# Patient Record
Sex: Female | Born: 1975 | Race: White | Hispanic: No | Marital: Married | Smoking: Current every day smoker
Health system: Southern US, Community
[De-identification: ages and names within clinical notes are randomized; demographics above are authoritative.]

## PROBLEM LIST (undated history)

## (undated) DIAGNOSIS — J45909 Unspecified asthma, uncomplicated: Secondary | ICD-10-CM

## (undated) DIAGNOSIS — J40 Bronchitis, not specified as acute or chronic: Secondary | ICD-10-CM

## (undated) HISTORY — PX: TUBAL LIGATION: SHX77

## (undated) HISTORY — PX: CHOLECYSTECTOMY: SHX55

---

## 2001-05-14 ENCOUNTER — Other Ambulatory Visit: Admission: RE | Admit: 2001-05-14 | Discharge: 2001-05-14 | Payer: Self-pay | Admitting: Obstetrics and Gynecology

## 2001-10-09 ENCOUNTER — Emergency Department (HOSPITAL_COMMUNITY): Admission: EM | Admit: 2001-10-09 | Discharge: 2001-10-09 | Payer: Self-pay | Admitting: Internal Medicine

## 2002-11-24 ENCOUNTER — Ambulatory Visit (HOSPITAL_COMMUNITY): Admission: RE | Admit: 2002-11-24 | Discharge: 2002-11-24 | Payer: Self-pay | Admitting: Obstetrics and Gynecology

## 2002-11-28 ENCOUNTER — Ambulatory Visit (HOSPITAL_COMMUNITY): Admission: RE | Admit: 2002-11-28 | Discharge: 2002-11-28 | Payer: Self-pay | Admitting: Obstetrics and Gynecology

## 2002-12-20 ENCOUNTER — Emergency Department (HOSPITAL_COMMUNITY): Admission: EM | Admit: 2002-12-20 | Discharge: 2002-12-21 | Payer: Self-pay | Admitting: Emergency Medicine

## 2002-12-20 ENCOUNTER — Encounter: Payer: Self-pay | Admitting: Emergency Medicine

## 2003-01-08 ENCOUNTER — Ambulatory Visit (HOSPITAL_COMMUNITY): Admission: AD | Admit: 2003-01-08 | Discharge: 2003-01-08 | Payer: Self-pay | Admitting: Obstetrics and Gynecology

## 2003-01-30 ENCOUNTER — Ambulatory Visit (HOSPITAL_COMMUNITY): Admission: AD | Admit: 2003-01-30 | Discharge: 2003-01-30 | Payer: Self-pay | Admitting: Obstetrics and Gynecology

## 2003-02-23 ENCOUNTER — Observation Stay (HOSPITAL_COMMUNITY): Admission: AD | Admit: 2003-02-23 | Discharge: 2003-02-24 | Payer: Self-pay | Admitting: Obstetrics and Gynecology

## 2003-03-14 ENCOUNTER — Ambulatory Visit (HOSPITAL_COMMUNITY): Admission: AD | Admit: 2003-03-14 | Discharge: 2003-03-14 | Payer: Self-pay | Admitting: Obstetrics and Gynecology

## 2003-04-10 ENCOUNTER — Inpatient Hospital Stay (HOSPITAL_COMMUNITY): Admission: RE | Admit: 2003-04-10 | Discharge: 2003-04-12 | Payer: Self-pay | Admitting: Obstetrics & Gynecology

## 2003-04-18 ENCOUNTER — Emergency Department (HOSPITAL_COMMUNITY): Admission: EM | Admit: 2003-04-18 | Discharge: 2003-04-18 | Payer: Self-pay | Admitting: Emergency Medicine

## 2003-05-09 ENCOUNTER — Emergency Department (HOSPITAL_COMMUNITY): Admission: EM | Admit: 2003-05-09 | Discharge: 2003-05-10 | Payer: Self-pay | Admitting: Emergency Medicine

## 2003-06-15 ENCOUNTER — Emergency Department (HOSPITAL_COMMUNITY): Admission: EM | Admit: 2003-06-15 | Discharge: 2003-06-16 | Payer: Self-pay | Admitting: Emergency Medicine

## 2003-06-21 ENCOUNTER — Emergency Department (HOSPITAL_COMMUNITY): Admission: EM | Admit: 2003-06-21 | Discharge: 2003-06-21 | Payer: Self-pay | Admitting: Emergency Medicine

## 2003-07-22 ENCOUNTER — Emergency Department (HOSPITAL_COMMUNITY): Admission: EM | Admit: 2003-07-22 | Discharge: 2003-07-22 | Payer: Self-pay | Admitting: Internal Medicine

## 2003-07-27 ENCOUNTER — Emergency Department (HOSPITAL_COMMUNITY): Admission: EM | Admit: 2003-07-27 | Discharge: 2003-07-28 | Payer: Self-pay | Admitting: *Deleted

## 2003-10-01 ENCOUNTER — Ambulatory Visit (HOSPITAL_COMMUNITY): Admission: RE | Admit: 2003-10-01 | Discharge: 2003-10-01 | Payer: Self-pay | Admitting: Obstetrics and Gynecology

## 2003-12-14 ENCOUNTER — Ambulatory Visit (HOSPITAL_COMMUNITY): Admission: AD | Admit: 2003-12-14 | Discharge: 2003-12-14 | Payer: Self-pay | Admitting: Obstetrics and Gynecology

## 2004-02-04 ENCOUNTER — Ambulatory Visit (HOSPITAL_COMMUNITY): Admission: AD | Admit: 2004-02-04 | Discharge: 2004-02-04 | Payer: Self-pay | Admitting: Obstetrics and Gynecology

## 2004-02-21 ENCOUNTER — Observation Stay (HOSPITAL_COMMUNITY): Admission: RE | Admit: 2004-02-21 | Discharge: 2004-02-21 | Payer: Self-pay | Admitting: Obstetrics and Gynecology

## 2004-03-04 ENCOUNTER — Ambulatory Visit (HOSPITAL_COMMUNITY): Admission: AD | Admit: 2004-03-04 | Discharge: 2004-03-04 | Payer: Self-pay | Admitting: Obstetrics and Gynecology

## 2004-03-07 ENCOUNTER — Ambulatory Visit (HOSPITAL_COMMUNITY): Admission: AD | Admit: 2004-03-07 | Discharge: 2004-03-07 | Payer: Self-pay | Admitting: Obstetrics and Gynecology

## 2004-03-15 ENCOUNTER — Inpatient Hospital Stay (HOSPITAL_COMMUNITY): Admission: RE | Admit: 2004-03-15 | Discharge: 2004-03-18 | Payer: Self-pay | Admitting: Obstetrics & Gynecology

## 2004-05-17 ENCOUNTER — Emergency Department (HOSPITAL_COMMUNITY): Admission: EM | Admit: 2004-05-17 | Discharge: 2004-05-17 | Payer: Self-pay | Admitting: Emergency Medicine

## 2004-10-10 ENCOUNTER — Emergency Department (HOSPITAL_COMMUNITY): Admission: EM | Admit: 2004-10-10 | Discharge: 2004-10-10 | Payer: Self-pay | Admitting: Emergency Medicine

## 2004-10-24 ENCOUNTER — Emergency Department (HOSPITAL_COMMUNITY): Admission: EM | Admit: 2004-10-24 | Discharge: 2004-10-24 | Payer: Self-pay | Admitting: Emergency Medicine

## 2005-03-13 ENCOUNTER — Emergency Department (HOSPITAL_COMMUNITY): Admission: EM | Admit: 2005-03-13 | Discharge: 2005-03-13 | Payer: Self-pay | Admitting: Emergency Medicine

## 2005-08-22 ENCOUNTER — Emergency Department (HOSPITAL_COMMUNITY): Admission: EM | Admit: 2005-08-22 | Discharge: 2005-08-22 | Payer: Self-pay | Admitting: Emergency Medicine

## 2005-09-05 ENCOUNTER — Emergency Department (HOSPITAL_COMMUNITY): Admission: EM | Admit: 2005-09-05 | Discharge: 2005-09-05 | Payer: Self-pay | Admitting: Emergency Medicine

## 2005-09-18 ENCOUNTER — Ambulatory Visit (HOSPITAL_COMMUNITY): Admission: RE | Admit: 2005-09-18 | Discharge: 2005-09-18 | Payer: Self-pay | Admitting: Urology

## 2005-10-10 ENCOUNTER — Emergency Department (HOSPITAL_COMMUNITY): Admission: EM | Admit: 2005-10-10 | Discharge: 2005-10-10 | Payer: Self-pay | Admitting: Emergency Medicine

## 2005-10-17 ENCOUNTER — Emergency Department (HOSPITAL_COMMUNITY): Admission: EM | Admit: 2005-10-17 | Discharge: 2005-10-18 | Payer: Self-pay | Admitting: Emergency Medicine

## 2005-10-26 ENCOUNTER — Emergency Department (HOSPITAL_COMMUNITY): Admission: EM | Admit: 2005-10-26 | Discharge: 2005-10-26 | Payer: Self-pay | Admitting: Emergency Medicine

## 2005-12-29 ENCOUNTER — Emergency Department (HOSPITAL_COMMUNITY): Admission: EM | Admit: 2005-12-29 | Discharge: 2005-12-29 | Payer: Self-pay | Admitting: Emergency Medicine

## 2006-04-01 ENCOUNTER — Emergency Department (HOSPITAL_COMMUNITY): Admission: EM | Admit: 2006-04-01 | Discharge: 2006-04-01 | Payer: Self-pay | Admitting: Emergency Medicine

## 2006-06-29 ENCOUNTER — Emergency Department (HOSPITAL_COMMUNITY): Admission: EM | Admit: 2006-06-29 | Discharge: 2006-06-29 | Payer: Self-pay | Admitting: Emergency Medicine

## 2006-09-15 ENCOUNTER — Emergency Department (HOSPITAL_COMMUNITY): Admission: EM | Admit: 2006-09-15 | Discharge: 2006-09-15 | Payer: Self-pay | Admitting: Emergency Medicine

## 2006-09-25 ENCOUNTER — Emergency Department (HOSPITAL_COMMUNITY): Admission: EM | Admit: 2006-09-25 | Discharge: 2006-09-26 | Payer: Self-pay | Admitting: Emergency Medicine

## 2006-09-30 ENCOUNTER — Emergency Department (HOSPITAL_COMMUNITY): Admission: EM | Admit: 2006-09-30 | Discharge: 2006-10-01 | Payer: Self-pay | Admitting: Emergency Medicine

## 2006-12-03 ENCOUNTER — Emergency Department (HOSPITAL_COMMUNITY): Admission: EM | Admit: 2006-12-03 | Discharge: 2006-12-03 | Payer: Self-pay | Admitting: Emergency Medicine

## 2007-03-06 ENCOUNTER — Emergency Department (HOSPITAL_COMMUNITY): Admission: EM | Admit: 2007-03-06 | Discharge: 2007-03-06 | Payer: Self-pay | Admitting: Emergency Medicine

## 2007-04-02 ENCOUNTER — Emergency Department (HOSPITAL_COMMUNITY): Admission: EM | Admit: 2007-04-02 | Discharge: 2007-04-02 | Payer: Self-pay | Admitting: Emergency Medicine

## 2007-04-06 ENCOUNTER — Emergency Department (HOSPITAL_COMMUNITY): Admission: EM | Admit: 2007-04-06 | Discharge: 2007-04-07 | Payer: Self-pay | Admitting: *Deleted

## 2007-04-16 ENCOUNTER — Emergency Department (HOSPITAL_COMMUNITY): Admission: EM | Admit: 2007-04-16 | Discharge: 2007-04-16 | Payer: Self-pay | Admitting: Emergency Medicine

## 2007-05-03 ENCOUNTER — Emergency Department (HOSPITAL_COMMUNITY): Admission: EM | Admit: 2007-05-03 | Discharge: 2007-05-03 | Payer: Self-pay | Admitting: Emergency Medicine

## 2007-07-14 ENCOUNTER — Emergency Department (HOSPITAL_COMMUNITY): Admission: EM | Admit: 2007-07-14 | Discharge: 2007-07-14 | Payer: Self-pay | Admitting: Emergency Medicine

## 2007-07-29 ENCOUNTER — Emergency Department (HOSPITAL_COMMUNITY): Admission: EM | Admit: 2007-07-29 | Discharge: 2007-07-29 | Payer: Self-pay | Admitting: Emergency Medicine

## 2007-08-11 ENCOUNTER — Emergency Department (HOSPITAL_COMMUNITY): Admission: EM | Admit: 2007-08-11 | Discharge: 2007-08-11 | Payer: Self-pay | Admitting: Emergency Medicine

## 2007-08-26 ENCOUNTER — Emergency Department (HOSPITAL_COMMUNITY): Admission: EM | Admit: 2007-08-26 | Discharge: 2007-08-26 | Payer: Self-pay | Admitting: Emergency Medicine

## 2007-10-07 ENCOUNTER — Emergency Department (HOSPITAL_COMMUNITY): Admission: EM | Admit: 2007-10-07 | Discharge: 2007-10-07 | Payer: Self-pay | Admitting: Emergency Medicine

## 2007-12-03 ENCOUNTER — Emergency Department (HOSPITAL_COMMUNITY): Admission: EM | Admit: 2007-12-03 | Discharge: 2007-12-03 | Payer: Self-pay | Admitting: Emergency Medicine

## 2009-06-12 ENCOUNTER — Emergency Department (HOSPITAL_COMMUNITY): Admission: EM | Admit: 2009-06-12 | Discharge: 2009-06-12 | Payer: Self-pay | Admitting: Emergency Medicine

## 2010-02-12 ENCOUNTER — Emergency Department: Payer: Self-pay | Admitting: Emergency Medicine

## 2010-12-09 NOTE — H&P (Signed)
[name], [name]                ACCOUNT NO.:  [id number]   MEDICAL RECORD NO.:  [id number]          PATIENT TYPE:  AMB   LOCATION:                                FACILITY:  [facility]   PHYSICIAN:  Dennie Maizes, M.D.   DATE OF BIRTH:  08-17-1975   DATE OF ADMISSION:  09/18/2005  DATE OF DISCHARGE:  [facility]                                HISTORY & PHYSICAL   CHIEF COMPLAINT:  Left flank pain, left distal ureteral calculus and  obstruction.   HISTORY OF PRESENT ILLNESS:  This 35 year old female is referred to me by  the ER physician at [facility].  She experienced sudden onset of  severe left flank pain radiating to the front on September 05, 2005.  The  pain was very severe 9/10.  She also noticed mild hematuria and low-grade  fever.  She had urinary frequency x7-9 and nocturia x0.  She was passing  really small amounts of urine with each voiding.  She does not have a past  history of urolithiasis.  There is no history of urinary tract infection or  hematuria in the past.   Urinalysis in the emergency room revealed microhematuria.  The patient has  been evaluated with a noncontrast CT scan of abdomen and pelvis.  This  revealed a 6x2 mm size left distal ureteral calculus at the level of the  left ureterosacral junction at the proximal hydroureter and hydronephrosis.  The patient has not passed a stone.  She still has persistent moderate to  severe pain.  She is brought to the short-stay center for cystoscopy, left  retrograde pyelogram, ureteroscopy stone extraction and ureteral stent  placement.   PAST MEDICAL HISTORY:  Status post C-section x2 in 2004 and 2005.   MEDICATIONS:  Pain pill given by the emergency room, oxycodone, Cipro.   ALLERGIES:  DARVOCET and NONSTEROIDAL ANTI-INFLAMMATORY DRUGS.   EXAMINATION:  VITAL SIGNS:  Height 5 feet 7 inches, weight 290 pounds.  HEAD/EYES/EARS/NOSE AND THROAT:  Normal.  NECK:  No masses.  LUNGS:  Clear to auscultation.  HEART:   Regular rate and rhythm, no murmurs.  ABDOMEN:  Soft, no palpable flank mass, moderate costovertebral angle  tenderness was noted, bladder not palpable, no suprapubic tenderness.   IMPRESSION:  Left distal ureteral calculus with obstruction, left renal  colic, left hydronephrosis.   PLAN:  I discussed with the patient regarding the management options.  As  she has severe persistent pain, she wanted to undergo surgical removal of  the stone.  She is clear to undergo cystoscopy, left retrograde pyelogram  with  left ureteroscopy stone extraction and stent placement on September 18, 2005  at [facility].  I have discussed with the patient regarding the  diagnosis, operative details, the alternate treatments, the outcome,  possible risks and complications and she has agreed for the procedure to be  done.      Dennie Maizes, M.D.  Electronically Signed     SK/MEDQ  D:  09/17/2005  T:  09/17/2005  Job:  [id number]

## 2010-12-09 NOTE — Discharge Summary (Signed)
NAME:  [name], [name]                          ACCOUNT NO.:  [id number]   MEDICAL RECORD NO.:  [id number]                   PATIENT TYPE:  INP   LOCATION:  A418                                 FACILITY:  [facility]   PHYSICIAN:  Lazaro Arms, M.D.                DATE OF BIRTH:  [birth year]   DATE OF ADMISSION:  03/15/2004  DATE OF DISCHARGE:  03/18/2004                                 DISCHARGE SUMMARY   DISCHARGE DIAGNOSES:  1. Status post a repeat cesarean section with bilateral tubal ligation.  2. Uncomplicated postoperative course.   PROCEDURE:  Repeat cesarean section with bilateral tubal ligation.   Please refer to the transcribed history and physical, as well as the  antepartum chart, and the op note for details admission to the hospital.   HOSPITAL COURSE:  The patient was admitted after surgery.  She had an  unremarkable intraoperative course.  She was delivered of a viable female  infant weighing 7 pounds 7 ounces, had Apgar's of 8 and 9.  The infant did  suffer with post partum transient tachypnea of the newborn that was resolved  within 24 hours.  The baby is now doing fine.  No problems.  Feeding well.  Post-op day #1:  Her hemoglobin was 8.9, and her hematocrit was 25.  On post-  op day #3, it was stable.  Her preoperative H&H was 12 and 37.  She did have  some postpartum hypotonia and was given Methergine postoperatively for 24  hours and that significantly cut down her bleeding.  Her hemoglobin  obviously stable from post-op day #1 to post-op day #3.  She is ambulatory  without any symptoms.  She is tolerating her oral pain medicine.  Her  incision is clean, dry and intact.  Her abdominal exam is benign.  She has  had flatus and bowel movements.  She is discharged to home on the morning of  post-op day #3 in good stable condition to follow up in the office next week  to have her staples removed.     ___________________________________________              Lazaro Arms, M.D.   Loraine Maple  D:  03/18/2004  T:  03/18/2004  Job:  [id number]

## 2010-12-09 NOTE — Op Note (Signed)
[name], [name]                ACCOUNT NO.:  [id number]   MEDICAL RECORD NO.:  [id number]          PATIENT TYPE:  AMB   LOCATION:  DAY                           FACILITY:  [facility]   PHYSICIAN:  Dennie Maizes, M.D.   DATE OF BIRTH:  [birth year]   DATE OF PROCEDURE:  09/18/2005  DATE OF DISCHARGE:                                 OPERATIVE REPORT   PREOP DIAGNOSES:  1.  Left distal ureteral calculus with obstruction.  2.  Left renal colic.   POSTOP DIAGNOSES:  1.  Left distal ureteral calculus with obstruction.  2.  Left renal colic.   OPERATIVE PROCEDURE:  1.  Cystoscopy.  2.  Left retrograde pyelogram.  3.  Left ureteroscopy.  4.  Left ureteral stent placement.   ANESTHESIA:  General.   SURGEON:  Dennie Maizes, M.D.   COMPLICATIONS:  None.   INDICATIONS FOR PROCEDURE:  This 35 year old female was evaluated for severe  left flank pain. X-rays revealed a 6.2 mm size left distal ureteral calculus  with obstruction and hydronephrosis. The patient was unable to pass the  stone; and she was taken to the OR, today, for cystoscopy, left retrograde  pyelogram, left ureteroscopy, stone extraction, and a stent placement.   DESCRIPTION OF PROCEDURE:  Preoperative KUB revealed a calcific shadow in  the left mid pelvis. It was not sure whether this represented a calculus or  a phlebolith  The patient had persistent pain; and she was taken to the OR  for further evaluation.   General anesthesia was induced and the patient was placed on the OR table in  the dorsolithotomy position. The lower abdomen and genitalia were prepped  and draped in a sterile fashion. Cystoscopy was done with the 25-French  scope. The bladder mucosa was unremarkable. The trigone and right ureteral  orifice were normal. There was prominence of the left ureteral orifice with  edema and erythema. The 5-French wedge catheter was then placed in the left  ureteral orifice; and the retrograde pyelogram was done by  using 7 mL of  renografin-60 with fluoroscopy. There was some irregularity of the distal  ureter, but I could not see any definite filling defect. There was no  hydroureter or hydronephrosis.   The 5-French open-ended catheter was then placed in the left ureteral  orifice. A 0.138-inch Bentson guidewire with the flexible tip was then  advanced into the left renal pelvis. An 8.5 French rigid ureteroscope was  then inserted into the distal ureter. Examination was done up to the level  of pelvic brim; and no stone was seen. In the distal ureter, there was an  area of erythema suggestive of the site of impaction of the stone. I feel  the patient may have passed the stone. A 6 French, 26 cm size stent was then  inserted over the guidewire and placed in the left collecting system. The  patient was transferred to the PACU in a satisfactory condition.      Dennie Maizes, M.D.  Electronically Signed     SK/MEDQ  D:  09/18/2005  T:  09/18/2005  Job:  [id number]

## 2010-12-09 NOTE — Op Note (Signed)
NAME:  [name], [name]                          ACCOUNT NO.:  [id number]   MEDICAL RECORD NO.:  [id number]                   PATIENT TYPE:  INP   LOCATION:  A418                                 FACILITY:  [facility]   PHYSICIAN:  Lazaro Arms, M.D.                DATE OF BIRTH:  [birth year]   DATE OF PROCEDURE:  03/15/2004  DATE OF DISCHARGE:                                 OPERATIVE REPORT   PREOPERATIVE DIAGNOSES:  1. Intrauterine pregnancy at 38-6/[redacted] weeks gestation.  2. Previous Cesarean section.  3. Desired sterilization.   POSTOPERATIVE DIAGNOSES:  1. Intrauterine pregnancy at 38-6/[redacted] weeks gestation.  2. Previous Cesarean section.  3. Desired sterilization.   PROCEDURE:  Repeat Cesarean section with bilateral tubal ligation.   SURGEON:  Lazaro Arms, M.D.   ANESTHESIA:  Spinal.   FINDINGS:  Over a low transverse hysterotomy incision was delivered a viable  female infant at 68 with Apgars of 8 and 9, weighing 7 pounds and 7 ounces.  The vacuum extractor was used to easily facilitate delivery.  There was a  loose nuchal cord x1.  The placenta and cord were normal.  There were three  vessels in the cord.  Cord blood and cord gas were sent.  The uterus, tubes,  and ovaries were otherwise normal.   DESCRIPTION OF PROCEDURE:  The patient was taken to the operating room and  placed in the sitting position where she underwent a spinal anesthetic.  She  was then placed in the supine position with a roll under her right hip.  She  was prepped and draped in the usual sterile fashion, and a Foley catheter  was placed for continuous urine drainage.   A Pfannenstiel skin incision was made and carried down sharply to the rectus  fascia which was scored in the midline and extended laterally.  The fascia  was taken off of the muscles superiorly and inferiorly without difficulty.  The muscles were divided and the peritoneal cavity was entered.  A bladder  blade was placed.  The  vesicouterine serosal flap was created.  A low  transverse hysterotomy incision was made, and over this incision was  delivered a viable female at 0830 with Apgars of 8 and 9, weighing 7 pounds  and 7 ounces.  There was a loose nuchal cord x1 and a three-vessel cord.  Cord blood and cord gas were sent.  The infant was handed to Dr. Milford Cage who  was in attendance for routine neonatal resuscitation.  The placenta was  delivered spontaneously and not sent to pathology.  The uterus was  exteriorized and wiped clean with a clean lap pad.  It was closed in two  layers, the first being a running interlocking layer and the second being an  imbricating layer.  There was good hemostasis.   A modified Pomeroy bilateral tubal ligation was performed using 0 plain gut  bilaterally.  There was good hemostasis.  An approximately 2-cm segment was  taken out bilaterally.  The uterus was replaced.  All of the pedicles were  once again inspected and found to be hemostatic.  The muscles and peritoneum  were reapproximated loosely after the peritoneal cavity was irrigated.  The  fascia was closed using 0 Vicryl running.  The subcutaneous tissue was made  hemostatic and irrigated.  The skin was closed using skin staples.   The patient tolerated the procedure well.  She experienced 500 cc of blood  loss and was taken to the recovery room in good and stable condition.  All  counts were correct x3.      ___________________________________________                                            Lazaro Arms, M.D.   LHE/MEDQ  D:  03/15/2004  T:  03/15/2004  Job:  [id number]
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
## 2010-12-09 NOTE — H&P (Signed)
   NAME:  [name], [name]                          ACCOUNT NO.:  [id number]   MEDICAL RECORD NO.:  [id number]                   PATIENT TYPE:  AMB   LOCATION:  DAY                                  FACILITY:  [facility]   PHYSICIAN:  Lazaro Arms, M.D.                DATE OF BIRTH:  06-09-1976   DATE OF ADMISSION:  04/10/2003  DATE OF DISCHARGE:                                HISTORY & PHYSICAL   HISTORY OF PRESENT ILLNESS:  The patient is Cooley 35 year old white female,  gravida 3, para 1, abortus 1, with estimated date of delivery of April 21, 2003, currently at 38-2/7 weeks' gestation, who is admitted for Cooley  primary cesarean section because of Cooley breech-presentation baby.  She  declined an external cephalic version attempt and she is admitted for Cooley C-  section.   PAST MEDICAL HISTORY:  1. Significant for asthma.  2. Migraines.   PAST SURGICAL HISTORY:  D&C.   PAST OBSTETRICAL HISTORY:  She had Cooley vaginal delivery in 1999 and Cooley  miscarriage in 2002.   ALLERGIES:  None.   MEDICATIONS:  Prenatal vitamins and iron.   SOCIAL HISTORY:  She is single and lives with the father of the baby.  She  is currently unemployed.   PRENATAL LABORATORY DATA:  Blood type is Cooley-positive.  Antibody screen is  negative.  Rubella is immune.  HIV was nonreactive.  Hepatitis B was  negative.  Serology was nonreactive.  Her Pap smear was normal.  She had Cooley  positive Chlamydia test at the beginning of the pregnancy and was treated  and the repeat was negative for GC and Chlamydia.  Her group B strep was  positive.  Her Glucola was 75.   PHYSICAL EXAMINATION:  HEENT:  Unremarkable.  NECK:  Thyroid was normal.  LUNGS:  Lungs are clear.  HEART:  Heart is regular rate and rhythm without murmur, regurgitation or  gallop.  BREASTS:  Deferred.  ABDOMEN:  Fundal height is 39 cm.  PELVIC:  Her cervix is long, thick and closed.  EXTREMITIES:  Extremities warm with trace edema.   IMPRESSION:  1. Intrauterine  pregnancy at 38-plus-weeks' gestation.  2. Breech presentation.  3. Declines external cephalic version attempt.   PLAN:  The patient is admitted for primary cesarean section.  She  understands the risks, benefits, indications and alternatives and will  proceed.                                               Lazaro Arms, M.D.    Loraine Maple  D:  04/09/2003  T:  04/09/2003  Job:  [id number]

## 2010-12-09 NOTE — Discharge Summary (Signed)
   NAME:  [name], [name]                          ACCOUNT NO.:  [id number]   MEDICAL RECORD NO.:  [id number]                   PATIENT TYPE:  INP   LOCATION:  A418                                 FACILITY:  [facility]   PHYSICIAN:  Lazaro Arms, M.D.                DATE OF BIRTH:  September 23, 1975   DATE OF ADMISSION:  04/10/2003  DATE OF DISCHARGE:  04/12/2003                                 DISCHARGE SUMMARY   DISCHARGE DIAGNOSES:  1. Status post a primary cesarean section.  2. Unremarkable postoperative course.   PROCEDURE:  Primary cesarean section.   Please refer to the transcribed History and Physical and the operative note  for details of admission to the hospital.   HOSPITAL COURSE:  The patient was admitted postoperatively.  She had an  unremarkable postoperative course tolerating clear liquids and a regular  diet, voided without symptoms, was ambulatory.  Her hemoglobin and  hematocrit were appropriate and stable; postoperative day #1 were 10 and  29.7 with a normal white count.  Her incision was clean, dry, and intact.  She was discharged to home on the morning of postoperative day #2 in good  stable condition to follow up in the office next week to have her incision  evaluated and staples removed.  She was given instructions and precautions  for return prior to that time and Tylox and Motrin for pain.     ___________________________________________                                         Lazaro Arms, M.D.   LHE/MEDQ  D:  05/21/2003  T:  05/21/2003  Job:  [id number]

## 2010-12-09 NOTE — Op Note (Signed)
NAME:  [name], [name]                          ACCOUNT NO.:  [id number]   MEDICAL RECORD NO.:  [id number]                   PATIENT TYPE:  INP   LOCATION:  A418                                 FACILITY:  [facility]   PHYSICIAN:  Lazaro Arms, M.D.                DATE OF BIRTH:  1976/02/10   DATE OF PROCEDURE:  04/10/2003  DATE OF DISCHARGE:                                 OPERATIVE REPORT   PREOPERATIVE DIAGNOSES:  1. Intrauterine pregnancy at 38-2/[redacted] weeks gestation.  2. Breech presentation.  The patient declined external cephalic version     attempt.   POSTOPERATIVE DIAGNOSES:  1. Intrauterine pregnancy at 38-2/[redacted] weeks gestation.  2. Cephalic presentation.  3. Nuchal cord x4.   PROCEDURE:  Primary cesarean section.   SURGEON:  Lazaro Arms, M.D.   ANESTHESIA:  Spinal.   FINDINGS:  Over a low transverse hysterotomy incision was delivered a viable  female infant with Apgars of 9 and 9.  Interestingly at the time of surgery,  the infant was in the vertex presentation but had nuchal cord x4.  There was  clear amniotic fluid.  The placenta was normal.  The uterus, tubes, and  ovaries were normal.   DESCRIPTION OF PROCEDURE:  The patient was taken to the operating room and  placed in the sitting position where she underwent a spinal anesthetic.  She  was then placed in the supine position with a roll under her right hip.  She  was prepped and draped in the usual sterile fashion.   A Pfannenstiel skin incision was made and carried down sharply to the rectus  fascia which was scored in the midline and extended laterally.  The fascia  was taken off of the muscles superiorly and inferiorly without difficulty.  The muscles were divided and the peritoneal cavity entered.  The  vesicouterine serosal flap was created.  A low transverse hysterotomy  incision was made, and over this incision was delivered at 0821 on 04/10/2003  at viable female with Apgars of 9 and 9, weighing 7 pounds 9  ounces.  There  was a nuchal cord x4.  The infant was vertex.  The uterus was exteriorized.  After the placenta was delivered, it was closed in two layers, the first  being a running interlocking layer, the second being an imbricating layer.  The uterus was replaced into the peritoneal cavity.  All of the pedicles  were hemostatic.  The muscles were reapproximated loosely.  The fascia was  closed using 0 Vicryl running.  The subcutaneous tissue was made hemostatic  and irrigated.  The skin was closed using skin staples.   The patient tolerated the procedure well.  She experienced 750 cc of  blood  loss and was taken to the recovery room in good and stable condition.  All  counts were correct.      ___________________________________________  Lazaro Arms, M.D.   Loraine Maple  D:  05/21/2003  T:  05/21/2003  Job:  [id number]

## 2010-12-25 ENCOUNTER — Emergency Department (HOSPITAL_COMMUNITY)
Admission: EM | Admit: 2010-12-25 | Discharge: 2010-12-25 | Disposition: A | Discharge status: Home / Self Care | Payer: Medicaid Other | Attending: Emergency Medicine | Admitting: Emergency Medicine

## 2010-12-25 ENCOUNTER — Emergency Department (HOSPITAL_COMMUNITY): Payer: Medicaid Other

## 2010-12-25 DIAGNOSIS — S6390XA Sprain of unspecified part of unspecified wrist and hand, initial encounter: Secondary | ICD-10-CM | POA: Insufficient documentation

## 2010-12-25 DIAGNOSIS — R000 Tachycardia, unspecified: Secondary | ICD-10-CM | POA: Insufficient documentation

## 2010-12-25 DIAGNOSIS — F17200 Nicotine dependence, unspecified, uncomplicated: Secondary | ICD-10-CM | POA: Insufficient documentation

## 2010-12-25 DIAGNOSIS — S93409A Sprain of unspecified ligament of unspecified ankle, initial encounter: Secondary | ICD-10-CM | POA: Insufficient documentation

## 2010-12-25 DIAGNOSIS — Y998 Other external cause status: Secondary | ICD-10-CM | POA: Insufficient documentation

## 2011-09-18 ENCOUNTER — Emergency Department: Payer: Self-pay | Admitting: *Deleted

## 2011-09-18 LAB — URINALYSIS, COMPLETE
Bacteria: NONE SEEN
Bilirubin,UR: NEGATIVE
Glucose,UR: NEGATIVE mg/dL (ref 0–75)
Ketone: NEGATIVE
Leukocyte Esterase: NEGATIVE
Ph: 6 (ref 4.5–8.0)
RBC,UR: 3 /HPF (ref 0–5)
Specific Gravity: 1.014 (ref 1.003–1.030)
Squamous Epithelial: 2

## 2011-10-20 ENCOUNTER — Emergency Department: Payer: Self-pay | Admitting: Emergency Medicine

## 2012-12-31 ENCOUNTER — Encounter (HOSPITAL_COMMUNITY): Payer: Self-pay | Admitting: *Deleted

## 2012-12-31 ENCOUNTER — Emergency Department (HOSPITAL_COMMUNITY)
Admission: EM | Admit: 2012-12-31 | Discharge: 2012-12-31 | Disposition: A | Discharge status: Home / Self Care | Payer: Medicaid Other | Attending: Emergency Medicine | Admitting: Emergency Medicine

## 2012-12-31 DIAGNOSIS — J45901 Unspecified asthma with (acute) exacerbation: Secondary | ICD-10-CM | POA: Insufficient documentation

## 2012-12-31 DIAGNOSIS — Y929 Unspecified place or not applicable: Secondary | ICD-10-CM | POA: Insufficient documentation

## 2012-12-31 DIAGNOSIS — IMO0002 Reserved for concepts with insufficient information to code with codable children: Secondary | ICD-10-CM | POA: Insufficient documentation

## 2012-12-31 DIAGNOSIS — S80811D Abrasion, right lower leg, subsequent encounter: Secondary | ICD-10-CM

## 2012-12-31 DIAGNOSIS — W1789XA Other fall from one level to another, initial encounter: Secondary | ICD-10-CM | POA: Insufficient documentation

## 2012-12-31 DIAGNOSIS — L509 Urticaria, unspecified: Secondary | ICD-10-CM | POA: Insufficient documentation

## 2012-12-31 DIAGNOSIS — L299 Pruritus, unspecified: Secondary | ICD-10-CM | POA: Insufficient documentation

## 2012-12-31 DIAGNOSIS — F17200 Nicotine dependence, unspecified, uncomplicated: Secondary | ICD-10-CM | POA: Insufficient documentation

## 2012-12-31 DIAGNOSIS — Y939 Activity, unspecified: Secondary | ICD-10-CM | POA: Insufficient documentation

## 2012-12-31 DIAGNOSIS — Z79899 Other long term (current) drug therapy: Secondary | ICD-10-CM | POA: Insufficient documentation

## 2012-12-31 HISTORY — DX: Unspecified asthma, uncomplicated: J45.909

## 2012-12-31 HISTORY — DX: Bronchitis, not specified as acute or chronic: J40

## 2012-12-31 MED ORDER — HYDROCODONE-ACETAMINOPHEN 5-325 MG PO TABS: 1.0000 | ORAL_TABLET | ORAL | Status: DC | PRN

## 2012-12-31 MED ORDER — BACITRACIN-NEOMYCIN-POLYMYXIN 400-5-5000 EX OINT: TOPICAL_OINTMENT | Freq: Once | CUTANEOUS | Status: DC

## 2012-12-31 MED ORDER — BACITRACIN ZINC 500 UNIT/GM EX OINT
TOPICAL_OINTMENT | CUTANEOUS | Status: AC
  Administered 2012-12-31: 4
  Filled 2012-12-31: qty 2.7
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
## 2013-01-07 NOTE — ED Provider Notes (Signed)
Medical screening examination/treatment/procedure(s) were performed by non-physician practitioner and as supervising physician I was immediately available for consultation/collaboration.  Ozetta Flatley, MD 01/07/13 0807 

## 2013-04-30 ENCOUNTER — Emergency Department: Payer: Self-pay | Admitting: Emergency Medicine

## 2013-05-28 ENCOUNTER — Encounter (HOSPITAL_COMMUNITY): Payer: Self-pay | Admitting: Emergency Medicine

## 2013-05-28 ENCOUNTER — Emergency Department (HOSPITAL_COMMUNITY): Payer: Medicaid Other

## 2013-05-28 ENCOUNTER — Emergency Department (HOSPITAL_COMMUNITY)
Admission: EM | Admit: 2013-05-28 | Discharge: 2013-05-28 | Disposition: A | Discharge status: Home / Self Care | Payer: Medicaid Other | Attending: Emergency Medicine | Admitting: Emergency Medicine

## 2013-05-28 DIAGNOSIS — J40 Bronchitis, not specified as acute or chronic: Secondary | ICD-10-CM

## 2013-05-28 DIAGNOSIS — F17200 Nicotine dependence, unspecified, uncomplicated: Secondary | ICD-10-CM | POA: Insufficient documentation

## 2013-05-28 DIAGNOSIS — L739 Follicular disorder, unspecified: Secondary | ICD-10-CM

## 2013-05-28 DIAGNOSIS — L738 Other specified follicular disorders: Secondary | ICD-10-CM | POA: Insufficient documentation

## 2013-05-28 DIAGNOSIS — J45901 Unspecified asthma with (acute) exacerbation: Secondary | ICD-10-CM | POA: Insufficient documentation

## 2013-05-28 DIAGNOSIS — J029 Acute pharyngitis, unspecified: Secondary | ICD-10-CM | POA: Insufficient documentation

## 2013-05-28 DIAGNOSIS — Z79899 Other long term (current) drug therapy: Secondary | ICD-10-CM | POA: Insufficient documentation

## 2013-05-28 MED ORDER — DOXYCYCLINE HYCLATE 100 MG PO CAPS: 100.0000 mg | ORAL_CAPSULE | Freq: Two times a day (BID) | ORAL | Status: DC

## 2013-05-28 MED ORDER — PREDNISONE 10 MG PO TABS: ORAL_TABLET | ORAL | Status: DC

## 2013-05-28 MED ORDER — HYDROCODONE-ACETAMINOPHEN 5-325 MG PO TABS: 1.0000 | ORAL_TABLET | ORAL | Status: DC | PRN

## 2013-05-28 MED ORDER — DOXYCYCLINE HYCLATE 100 MG PO TABS
100.0000 mg | ORAL_TABLET | Freq: Once | ORAL | Status: AC
  Administered 2013-05-28: 100 mg via ORAL
  Filled 2013-05-28: qty 1

## 2013-05-28 MED ORDER — PREDNISONE 50 MG PO TABS
60.0000 mg | ORAL_TABLET | Freq: Once | ORAL | Status: AC
  Administered 2013-05-28: 21:00:00 60 mg via ORAL
  Filled 2013-05-28 (×2): qty 1

## 2013-05-28 MED ORDER — MELOXICAM 7.5 MG PO TABS: 7.5000 mg | ORAL_TABLET | Freq: Every day | ORAL | Status: DC

## 2013-05-28 NOTE — ED Notes (Signed)
Attempted to explain importance of taking abx to pt. Pt refused to take the medications. Wants pain med. Pt states she does not need abx, she needs pain medication.

## 2013-05-28 NOTE — ED Notes (Signed)
Pt refused medications. Would not take them.

## 2013-05-28 NOTE — ED Notes (Signed)
Sore throat, hurts to swallow, and I have been coughing. Also have a boil or abscess on my right inner thigh per pt.

## 2013-05-28 NOTE — ED Notes (Signed)
Pt finally agreed to take her medications.

## 2013-05-30 NOTE — ED Provider Notes (Signed)
CSN: [id number]     Arrival date & time 05/28/13  1925 History   First MD Initiated Contact with Patient 05/28/13 1948     Chief Complaint  Patient presents with  . Sore Throat  . Cough  . Abscess   (Consider location/radiation/quality/duration/timing/severity/associated sxs/prior Treatment) HPI Comments: [name] is a 37 y.o. Female presenting with 2 complaints,  The first being a week long history of nonproductive cough with episodes of wheezing and shortness of breath.  She has now developed a sore throat which is dry, painful and worse with coughing.  She denies fever, chest pain and has not had nasal congestion, sinus pain, ear ache or drainage. She reports history of asthma for which she uses albuterol prn,  Last dose taken early this morning.  Secondly,  She reports painful abscess on her right medial upper thigh which has been present for several days.  There has been no drainage from the site. Pain is worsened with palpation.  She denies any treatments or alleviators.     The history is provided by the patient.    Past Medical History  Diagnosis Date  . Asthma   . Bronchitis    Past Surgical History  Procedure Laterality Date  . Cholecystectomy    . Tubal ligation     History reviewed. No pertinent family history. History  Substance Use Topics  . Smoking status: Current Every Day Smoker -- 0.50 packs/day    Types: Cigarettes  . Smokeless tobacco: Never Used  . Alcohol Use: No   OB History   Grav Para Term Preterm Abortions TAB SAB Ect Mult Living   2 2 2             Review of Systems  Constitutional: Negative for fever.  HENT: Positive for sore throat. Negative for congestion, ear pain, postnasal drip and rhinorrhea.   Eyes: Negative.   Respiratory: Positive for cough, shortness of breath and wheezing. Negative for chest tightness and stridor.   Cardiovascular: Negative for chest pain.  Gastrointestinal: Negative for nausea and abdominal pain.   Genitourinary: Negative.   Musculoskeletal: Negative for arthralgias, joint swelling and neck pain.  Skin: Negative for rash.       Negative except as mentioned in HPI.    Neurological: Negative for dizziness, weakness, light-headedness, numbness and headaches.  Psychiatric/Behavioral: Negative.     Allergies  Codeine; Nsaids; Pineapple; Darvocet; Toradol; and Tramadol  Home Medications   Current Outpatient Rx  Name  Route  Sig  Dispense  Refill  . albuterol (PROVENTIL HFA;VENTOLIN HFA) 108 (90 BASE) MCG/ACT inhaler   Inhalation   Inhale 2 puffs into the lungs every 6 (six) hours as needed for wheezing or shortness of breath.         . doxycycline (VIBRAMYCIN) 100 MG capsule   Oral   Take 1 capsule (100 mg total) by mouth 2 (two) times daily.   20 capsule   0   . HYDROcodone-acetaminophen (NORCO/VICODIN) 5-325 MG per tablet   Oral   Take 1 tablet by mouth every 4 (four) hours as needed for moderate pain.   10 tablet   0   . predniSONE (DELTASONE) 10 MG tablet      6, 5, 4, 3, 2 then 1 tablet by mouth daily for 6 days total.   21 tablet   0    BP 115/87  Pulse 112  Temp(Src) 98.5 F (36.9 C) (Oral)  Resp 20  Ht 5\' 7"  (1.702 m)  Wt 210 lb (95.255 kg)  BMI 32.88 kg/m2  SpO2 97%  LMP 05/18/2013 Physical Exam  Constitutional: She is oriented to person, place, and time. She appears well-developed and well-nourished.  HENT:  Head: Normocephalic and atraumatic.  Right Ear: Tympanic membrane and ear canal normal.  Left Ear: Tympanic membrane and ear canal normal.  Nose: No mucosal edema or rhinorrhea.  Mouth/Throat: Uvula is midline and mucous membranes are normal. Posterior oropharyngeal erythema present. No oropharyngeal exudate, posterior oropharyngeal edema or tonsillar abscesses.  Eyes: Conjunctivae are normal.  Cardiovascular: Normal rate and normal heart sounds.   Pulmonary/Chest: Effort normal. No respiratory distress. She has no decreased breath sounds.  She has no wheezes. She has no rhonchi. She has no rales.  Frequent dry cough, no wheezing at present.  Pulse rate 86 during exam.  Abdominal: Soft. There is no tenderness.  Musculoskeletal: Normal range of motion.  Neurological: She is alert and oriented to person, place, and time.  Skin: Skin is warm and dry. No rash noted.  0.5 cm papule right medial thigh, slightly raised, no tenting or drainage.  Dark center,  No erythema.  Psychiatric: She has a normal mood and affect.    ED Course  Procedures (including critical care time)  INCISION AND DRAINAGE Performed by: Burgess Amor Consent: Verbal consent obtained. Risks and benefits: risks, benefits and alternatives were discussed Type: abscess  Body area: right medial thigh  Anesthesia: freeze spray #18 needle for aspiration  Local anesthetic: freeze spray Anesthetic total: na  Complexity: simple Drainage: blood only, no purulence Drainage amount: small  Packing material: na Patient tolerance: Patient tolerated the procedure well with no immediate complications. Band aid applied    Labs Review Labs Reviewed - No data to display Imaging Review Dg Chest 2 View  05/28/2013   CLINICAL DATA:  Cough and shortness of breath for 1 week  EXAM: CHEST  2 VIEW  COMPARISON:  None.  FINDINGS: The heart size and mediastinal contours are within normal limits. Both lungs are clear. The visualized skeletal structures are unremarkable.  IMPRESSION: No active cardiopulmonary disease.   Electronically Signed   By: Esperanza Heir M.D.   On: 05/28/2013 20:55    EKG Interpretation   None       MDM   1. Bronchitis   2. Folliculitis    Pt placed on doxycycline for skin infection and will give coverage for this bronchitis in an asthmatic patient.  She was resistant to any treatment except for pain medicine.  She is allergic to all nsaids.  She was given a small quantity of hydrocodone.  Encouraged warm epsom salt soaks,  Avoid squeezing the  papule site.  Continue using albuterol prn.  Pt also placed on prednisone taper for history of wheezing, although none at present.  Planned f/u with pcp prn if sx not improved.  The patient appears reasonably screened and/or stabilized for discharge and I doubt any other medical condition or other [facility] requiring further screening, evaluation, or treatment in the ED at this time prior to discharge.     Burgess Amor, PA-C 05/30/13 1249

## 2013-06-19 NOTE — ED Provider Notes (Signed)
Medical screening examination/treatment/procedure(s) were performed by non-physician practitioner and as supervising physician I was immediately available for consultation/collaboration.  EKG Interpretation   None        Hurman Horn, MD 06/19/13 [phone]

## 2013-07-22 ENCOUNTER — Emergency Department (HOSPITAL_COMMUNITY)
Admission: EM | Admit: 2013-07-22 | Discharge: 2013-07-22 | Disposition: A | Discharge status: Home / Self Care | Payer: Medicaid Other | Attending: Emergency Medicine | Admitting: Emergency Medicine

## 2013-07-22 ENCOUNTER — Encounter (HOSPITAL_COMMUNITY): Payer: Self-pay | Admitting: Emergency Medicine

## 2013-07-22 DIAGNOSIS — J45909 Unspecified asthma, uncomplicated: Secondary | ICD-10-CM | POA: Insufficient documentation

## 2013-07-22 DIAGNOSIS — F17200 Nicotine dependence, unspecified, uncomplicated: Secondary | ICD-10-CM | POA: Insufficient documentation

## 2013-07-22 DIAGNOSIS — K0889 Other specified disorders of teeth and supporting structures: Secondary | ICD-10-CM

## 2013-07-22 DIAGNOSIS — Z79899 Other long term (current) drug therapy: Secondary | ICD-10-CM | POA: Insufficient documentation

## 2013-07-22 DIAGNOSIS — K089 Disorder of teeth and supporting structures, unspecified: Secondary | ICD-10-CM | POA: Insufficient documentation

## 2013-07-22 MED ORDER — PENICILLIN V POTASSIUM 500 MG PO TABS: 500.0000 mg | ORAL_TABLET | Freq: Four times a day (QID) | ORAL | Status: AC

## 2013-07-22 NOTE — ED Notes (Signed)
Patient c/o left lower tooth pain.  Patient states tooth broke last night.

## 2013-07-22 NOTE — ED Provider Notes (Signed)
CSN: [id number]     Arrival date & time 07/22/13  0023 History   First MD Initiated Contact with Patient 07/22/13 [phone]     Chief Complaint  Patient presents with  . Dental Pain    Patient is a 37 y.o. female presenting with tooth pain. The history is provided by the patient.  Dental Pain Location:  Lower Severity:  Moderate Onset quality:  Gradual Duration:  1 day Timing:  Constant Progression:  Worsening Chronicity:  New Relieved by:  Nothing Associated symptoms: no fever     Past Medical History  Diagnosis Date  . Asthma   . Bronchitis    Past Surgical History  Procedure Laterality Date  . Cholecystectomy    . Tubal ligation     No family history on file. History  Substance Use Topics  . Smoking status: Current Every Day Smoker -- 0.50 packs/day    Types: Cigarettes  . Smokeless tobacco: Never Used  . Alcohol Use: No   OB History   Grav Para Term Preterm Abortions TAB SAB Ect Mult Living   2 2 2             Review of Systems  Constitutional: Negative for fever.  Gastrointestinal: Negative for vomiting.    Allergies  Codeine; Nsaids; Pineapple; Darvocet; Toradol; and Tramadol  Home Medications   Current Outpatient Rx  Name  Route  Sig  Dispense  Refill  . albuterol (PROVENTIL HFA;VENTOLIN HFA) 108 (90 BASE) MCG/ACT inhaler   Inhalation   Inhale 2 puffs into the lungs every 6 (six) hours as needed for wheezing or shortness of breath.         . penicillin v potassium (VEETID) 500 MG tablet   Oral   Take 1 tablet (500 mg total) by mouth 4 (four) times daily.   40 tablet   0    BP 140/76  Pulse 102  Temp(Src) 98.4 F (36.9 C) (Oral)  Resp 18  SpO2 100%  LMP 06/22/2013 Physical Exam CONSTITUTIONAL: Well developed/well nourished HEAD AND FACE: Normocephalic/atraumatic EYES: EOMI/PERRL ENMT: Mucous membranes moist.  Poor dentition.  No trismus.  No focal abscess noted. NECK: supple no meningeal signs CV: S1/S2 noted, no murmurs/rubs/gallops  noted LUNGS: Lungs are clear to auscultation bilaterally, no apparent distress ABDOMEN: soft, nontender, no rebound or guarding NEURO: Pt is awake/alert, moves all extremitiesx4 EXTREMITIES:full ROM SKIN: warm, color normal  ED Course  Procedures (including critical care time) Labs Review Labs Reviewed - No data to display Imaging Review No results found.  EKG Interpretation   None       MDM   1. Pain, dental    Nursing notes including past medical history and social history reviewed and considered in documentation  I offered pain meds here but she is driving home For home care I advised PCN and ibuprofen 600mg  TID.  She reports she has been taking that among other medications from her various friends and I advised against taking meds that aren't prescribed for her Pt left before paperwork could be given     Joya Gaskins, MD 07/22/13 [phone]

## 2013-07-22 NOTE — ED Notes (Signed)
Patient walked out and stated she would go to another hospital after being seen by Dr. Bebe Shaggy and told her would not prescribe her pain medication, but he would prescribe an antibiotic and referral to dentist.  Patient ambulatory.

## 2013-09-05 ENCOUNTER — Emergency Department (HOSPITAL_COMMUNITY)
Admission: EM | Admit: 2013-09-05 | Discharge: 2013-09-05 | Disposition: A | Discharge status: Home / Self Care | Payer: Medicaid Other | Attending: Emergency Medicine | Admitting: Emergency Medicine

## 2013-09-05 ENCOUNTER — Encounter (HOSPITAL_COMMUNITY): Payer: Self-pay | Admitting: Emergency Medicine

## 2013-09-05 DIAGNOSIS — J209 Acute bronchitis, unspecified: Secondary | ICD-10-CM | POA: Insufficient documentation

## 2013-09-05 DIAGNOSIS — F17200 Nicotine dependence, unspecified, uncomplicated: Secondary | ICD-10-CM | POA: Insufficient documentation

## 2013-09-05 DIAGNOSIS — J45909 Unspecified asthma, uncomplicated: Secondary | ICD-10-CM

## 2013-09-05 DIAGNOSIS — Z79899 Other long term (current) drug therapy: Secondary | ICD-10-CM | POA: Insufficient documentation

## 2013-09-05 DIAGNOSIS — J45901 Unspecified asthma with (acute) exacerbation: Secondary | ICD-10-CM | POA: Insufficient documentation

## 2013-09-05 DIAGNOSIS — J40 Bronchitis, not specified as acute or chronic: Secondary | ICD-10-CM

## 2013-09-05 MED ORDER — HYDROCODONE-HOMATROPINE 5-1.5 MG/5ML PO SYRP: 5.0000 mL | ORAL_SOLUTION | Freq: Four times a day (QID) | ORAL | Status: DC | PRN

## 2013-09-05 MED ORDER — PREDNISONE 20 MG PO TABS: 60.0000 mg | ORAL_TABLET | Freq: Every day | ORAL | Status: DC

## 2013-09-05 MED ORDER — ALBUTEROL SULFATE HFA 108 (90 BASE) MCG/ACT IN AERS
2.0000 | INHALATION_SPRAY | RESPIRATORY_TRACT | Status: DC | PRN
  Administered 2013-09-05 (×2): 2 via RESPIRATORY_TRACT
  Filled 2013-09-05: qty 6.7

## 2013-09-05 MED ORDER — AMOXICILLIN 500 MG PO CAPS: 500.0000 mg | ORAL_CAPSULE | Freq: Three times a day (TID) | ORAL | Status: DC

## 2013-09-05 NOTE — ED Provider Notes (Signed)
CSN: [id number]     Arrival date & time 09/05/13  2000 History   First MD Initiated Contact with Patient 09/05/13 2028     Chief Complaint  Patient presents with  . Cough     (Consider location/radiation/quality/duration/timing/severity/associated sxs/prior Treatment) HPI Comments: Patient presents to ER for evaluation of sinus congestion, cough and difficulty breathing for one week. Patient does report a previous history of asthma, not currently using any inhalers. She reports that she has had sharp pains in the left side of her ribs when she coughs. There has not been any fever. She did have some nausea and vomiting earlier in the week.  Patient is a 38 y.o. female presenting with cough.  Cough Associated symptoms: shortness of breath and wheezing     Past Medical History  Diagnosis Date  . Asthma   . Bronchitis    Past Surgical History  Procedure Laterality Date  . Cholecystectomy    . Tubal ligation     History reviewed. No pertinent family history. History  Substance Use Topics  . Smoking status: Current Every Day Smoker -- 0.50 packs/day    Types: Cigarettes  . Smokeless tobacco: Never Used  . Alcohol Use: No   OB History   Grav Para Term Preterm Abortions TAB SAB Ect Mult Living   2 2 2             Review of Systems  Respiratory: Positive for cough, shortness of breath and wheezing.   Gastrointestinal: Positive for vomiting.  All other systems reviewed and are negative.      Allergies  Codeine; Nsaids; Pineapple; Darvocet; Toradol; and Tramadol  Home Medications   Current Outpatient Rx  Name  Route  Sig  Dispense  Refill  . albuterol (PROVENTIL HFA;VENTOLIN HFA) 108 (90 BASE) MCG/ACT inhaler   Inhalation   Inhale 2 puffs into the lungs every 6 (six) hours as needed for wheezing or shortness of breath.          BP 141/71  Pulse 85  Temp(Src) 98.3 F (36.8 C) (Oral)  Resp 17  Ht 5\' 6"  (1.676 m)  Wt 200 lb (90.719 kg)  BMI 32.30 kg/m2  SpO2  98%  LMP 08/24/2013 Physical Exam  Constitutional: She is oriented to person, place, and time. She appears well-developed and well-nourished. No distress.  HENT:  Head: Normocephalic and atraumatic.  Right Ear: Hearing normal.  Left Ear: Hearing normal.  Nose: Nose normal.  Mouth/Throat: Oropharynx is clear and moist and mucous membranes are normal.  Eyes: Conjunctivae and EOM are normal. Pupils are equal, round, and reactive to light.  Neck: Normal range of motion. Neck supple.  Cardiovascular: Regular rhythm, S1 normal and S2 normal.  Exam reveals no gallop and no friction rub.   No murmur heard. Pulmonary/Chest: Effort normal and breath sounds normal. No respiratory distress. She exhibits no tenderness.  Abdominal: Soft. Normal appearance and bowel sounds are normal. There is no hepatosplenomegaly. There is no tenderness. There is no rebound, no guarding, no tenderness at McBurney's point and negative Murphy's sign. No hernia.  Musculoskeletal: Normal range of motion.  Neurological: She is alert and oriented to person, place, and time. She has normal strength. No cranial nerve deficit or sensory deficit. Coordination normal. GCS eye subscore is 4. GCS verbal subscore is 5. GCS motor subscore is 6.  Skin: Skin is warm, dry and intact. No rash noted. No cyanosis.  Psychiatric: She has a normal mood and affect. Her speech is normal and  behavior is normal. Thought content normal.    ED Course  Procedures (including critical care time) Labs Review Labs Reviewed - No data to display Imaging Review No results found.  EKG Interpretation   None       MDM   Final diagnoses:  Bronchitis   Patient presents to ER for evaluation of cough, chest and head congestion. Symptoms present for a week. She does have a history of asthma. Currently not on any medications. Lungs are clear, no concern for pneumonia. Oxygenation 98%. Treat empirically for bronchitis.    Gilda Crease,  MD 09/05/13 2043

## 2013-09-05 NOTE — ED Notes (Addendum)
Cough, sinus congestion x 1 week.  Chest discomfort developed w/coughing.  Denies fever and diarrhea.  Has had n/v  X 2 and chills and slight SOB.

## 2013-09-05 NOTE — Discharge Instructions (Signed)
Asthma, Adult °Asthma is a recurring condition in which the airways tighten and narrow. Asthma can make it difficult to breathe. It can cause coughing, wheezing, and shortness of breath. Asthma episodes (also called asthma attacks) range from minor to life-threatening. Asthma cannot be cured, but medicines and lifestyle changes can help control it. °CAUSES °Asthma is believed to be caused by inherited (genetic) and environmental factors, but its exact cause is unknown. Asthma may be triggered by allergens, lung infections, or irritants in the air. Asthma triggers are different for each person. Common triggers include:  °· Animal dander. °· Dust mites. °· Cockroaches. °· Pollen from trees or grass. °· Mold. °· Smoke. °· Air pollutants such as dust, household cleaners, hair sprays, aerosol sprays, paint fumes, strong chemicals, or strong odors. °· Cold air, weather changes, and winds (which increase molds and pollens in the air). °· Strong emotional expressions such as crying or laughing hard. °· Stress. °· Certain medicines (such as aspirin) or types of drugs (such as beta-blockers). °· Sulfites in foods and drinks. Foods and drinks that may contain sulfites include dried fruit, potato chips, and sparkling grape juice. °· Infections or inflammatory conditions such as the flu, a cold, or an inflammation of the nasal membranes (rhinitis). °· Gastroesophageal reflux disease (GERD). °· Exercise or strenuous activity. °SYMPTOMS °Symptoms may occur immediately after asthma is triggered or many hours later. Symptoms include: °· Wheezing. °· Excessive nighttime or early morning coughing. °· Frequent or severe coughing with a common cold. °· Chest tightness. °· Shortness of breath. °DIAGNOSIS  °The diagnosis of asthma is made by a review of your medical history and a physical exam. Tests may also be performed. These may include: °· Lung function studies. These tests show how much air you breath in and out. °· Allergy  tests. °· Imaging tests such as X-rays. °TREATMENT  °Asthma cannot be cured, but it can usually be controlled. Treatment involves identifying and avoiding your asthma triggers. It also involves medicines. There are 2 classes of medicine used for asthma treatment:  °· Controller medicines. These prevent asthma symptoms from occurring. They are usually taken every day. °· Reliever or rescue medicines. These quickly relieve asthma symptoms. They are used as needed and provide short-term relief. °Your health care provider will help you create an asthma action plan. An asthma action plan is a written plan for managing and treating your asthma attacks. It includes a list of your asthma triggers and how they may be avoided. It also includes information on when medicines should be taken and when their dosage should be changed. An action plan may also involve the use of a device called a peak flow meter. A peak flow meter measures how well the lungs are working. It helps you monitor your condition. °HOME CARE INSTRUCTIONS  °· Take medicine as directed by your health care provider. Speak with your health care provider if you have questions about how or when to take the medicines. °· Use a peak flow meter as directed by your health care provider. Record and keep track of readings. °· Understand and use the action plan to help minimize or stop an asthma attack without needing to seek medical care. °· Control your home environment in the following ways to help prevent asthma attacks: °· Do not smoke. Avoid being exposed to secondhand smoke. °· Change your heating and air conditioning filter regularly. °· Limit your use of fireplaces and wood stoves. °· Get rid of pests (such as roaches and   mice) and their droppings. °· Throw away plants if you see mold on them. °· Clean your floors and dust regularly. Use unscented cleaning products. °· Try to have someone else vacuum for you regularly. Stay out of rooms while they are being  vacuumed and for a short while afterward. If you vacuum, use a dust mask from a hardware store, a double-layered or microfilter vacuum cleaner bag, or a vacuum cleaner with a HEPA filter. °· Replace carpet with wood, tile, or vinyl flooring. Carpet can trap dander and dust. °· Use allergy-proof pillows, mattress covers, and box spring covers. °· Wash bed sheets and blankets every week in hot water and dry them in a dryer. °· Use blankets that are made of polyester or cotton. °· Clean bathrooms and kitchens with bleach. If possible, have someone repaint the walls in these rooms with mold-resistant paint. Keep out of the rooms that are being cleaned and painted. °· Wash hands frequently. °SEEK MEDICAL CARE IF:  °· You have wheezing, shortness of breath, or a cough even if taking medicine to prevent attacks. °· The colored mucus you cough up (sputum) is thicker than usual. °· Your sputum changes from clear or white to yellow, green, gray, or bloody. °· You have any problems that may be related to the medicines you are taking (such as a rash, itching, swelling, or trouble breathing). °· You are using a reliever medicine more than 2 3 times per week. °· Your peak flow is still at 50 79% of you personal best after following your action plan for 1 hour. °SEEK IMMEDIATE MEDICAL CARE IF:  °· You seem to be getting worse and are unresponsive to treatment during an asthma attack. °· You are short of breath even at rest. °· You get short of breath when doing very little physical activity. °· You have difficulty eating, drinking, or talking due to asthma symptoms. °· You develop chest pain. °· You develop a fast heartbeat. °· You have a bluish color to your lips or fingernails. °· You are lightheaded, dizzy, or faint. °· Your peak flow is less than 50% of your personal best. °· You have a fever or persistent symptoms for more than 2 3 days. °· You have a fever and symptoms suddenly get worse. °MAKE SURE YOU:  °· Understand these  instructions. °· Will watch your condition. °· Will get help right away if you are not doing well or get worse. °Document Released: 07/10/2005 Document Revised: 03/12/2013 Document Reviewed: 02/06/2013 °ExitCare® Patient Information ©2014 [employer]. ° °Bronchitis °Bronchitis is inflammation of the airways that extend from the windpipe into the lungs (bronchi). The inflammation often causes mucus to develop, which leads to a cough. If the inflammation becomes severe, it may cause shortness of breath. °CAUSES  °Bronchitis may be caused by:  °· Viral infections.   °· Bacteria.   °· Cigarette smoke.   °· Allergens, pollutants, and other irritants.   °SIGNS AND SYMPTOMS  °The most common symptom of bronchitis is a frequent cough that produces mucus. Other symptoms include: °· Fever.   °· Body aches.   °· Chest congestion.   °· Chills.   °· Shortness of breath.   °· Sore throat.   °DIAGNOSIS  °Bronchitis is usually diagnosed through a medical history and physical exam. Tests, such as chest X-rays, are sometimes done to rule out other conditions.  °TREATMENT  °You may need to avoid contact with whatever caused the problem (smoking, for example). Medicines are sometimes needed. These may include: °· Antibiotics. These may be prescribed   if the condition is caused by bacteria. °· Cough suppressants. These may be prescribed for relief of cough symptoms.   °· Inhaled medicines. These may be prescribed to help open your airways and make it easier for you to breathe.   °· Steroid medicines. These may be prescribed for those with recurrent (chronic) bronchitis. °HOME CARE INSTRUCTIONS °· Get plenty of rest.   °· Drink enough fluids to keep your urine clear or pale yellow (unless you have a medical condition that requires fluid restriction). Increasing fluids may help thin your secretions and will prevent dehydration.   °· Only take over-the-counter or prescription medicines as directed by your health care provider. °· Only  take antibiotics as directed. Make sure you finish them even if you start to feel better. °· Avoid secondhand smoke, irritating chemicals, and strong fumes. These will make bronchitis worse. If you are a smoker, quit smoking. Consider using nicotine gum or skin patches to help control withdrawal symptoms. Quitting smoking will help your lungs heal faster.   °· Put a cool-mist humidifier in your bedroom at night to moisten the air. This may help loosen mucus. Change the water in the humidifier daily. You can also run the hot water in your shower and sit in the bathroom with the door closed for 5 10 minutes.   °· Follow up with your health care provider as directed.   °· Wash your hands frequently to avoid catching bronchitis again or spreading an infection to others.   °SEEK MEDICAL CARE IF: °Your symptoms do not improve after 1 week of treatment.  °SEEK IMMEDIATE MEDICAL CARE IF: °· Your fever increases. °· You have chills.   °· You have chest pain.   °· You have worsening shortness of breath.   °· You have bloody sputum. °· You faint.   °· You have lightheadedness. °· You have a severe headache.   °· You vomit repeatedly. °MAKE SURE YOU:  °· Understand these instructions. °· Will watch your condition. °· Will get help right away if you are not doing well or get worse. °Document Released: 07/10/2005 Document Revised: 04/30/2013 Document Reviewed: 03/04/2013 °ExitCare® Patient Information ©2014 [employer]. ° °

## 2013-10-08 ENCOUNTER — Emergency Department (HOSPITAL_COMMUNITY)
Admission: EM | Admit: 2013-10-08 | Discharge: 2013-10-08 | Disposition: A | Discharge status: Home / Self Care | Payer: Medicaid Other | Attending: Emergency Medicine | Admitting: Emergency Medicine

## 2013-10-08 ENCOUNTER — Encounter (HOSPITAL_COMMUNITY): Payer: Self-pay | Admitting: Emergency Medicine

## 2013-10-08 DIAGNOSIS — L723 Sebaceous cyst: Secondary | ICD-10-CM | POA: Insufficient documentation

## 2013-10-08 DIAGNOSIS — F17200 Nicotine dependence, unspecified, uncomplicated: Secondary | ICD-10-CM | POA: Insufficient documentation

## 2013-10-08 DIAGNOSIS — Z79899 Other long term (current) drug therapy: Secondary | ICD-10-CM | POA: Insufficient documentation

## 2013-10-08 DIAGNOSIS — J45909 Unspecified asthma, uncomplicated: Secondary | ICD-10-CM | POA: Insufficient documentation

## 2013-10-08 MED ORDER — HYDROCODONE-ACETAMINOPHEN 5-325 MG PO TABS
1.0000 | ORAL_TABLET | Freq: Once | ORAL | Status: AC
  Administered 2013-10-08: 1 via ORAL
  Filled 2013-10-08: qty 1

## 2013-10-08 MED ORDER — MUPIROCIN 2 % EX OINT: TOPICAL_OINTMENT | CUTANEOUS | Status: DC

## 2013-10-08 MED ORDER — DOXYCYCLINE HYCLATE 100 MG PO CAPS: 100.0000 mg | ORAL_CAPSULE | Freq: Two times a day (BID) | ORAL | Status: DC

## 2013-10-08 MED ORDER — HYDROCODONE-ACETAMINOPHEN 5-325 MG PO TABS: ORAL_TABLET | ORAL | Status: DC

## 2013-10-08 MED ORDER — DOXYCYCLINE HYCLATE 100 MG PO TABS
100.0000 mg | ORAL_TABLET | Freq: Once | ORAL | Status: AC
  Administered 2013-10-08: 100 mg via ORAL
  Filled 2013-10-08: qty 1

## 2013-10-08 NOTE — Discharge Instructions (Signed)
Epidermal Cyst  An epidermal cyst is usually a small, painless lump under the skin. Cysts often occur on the face, neck, stomach, chest, or genitals. The cyst may be filled with a bad smelling paste. Do not pop your cyst. Popping the cyst can cause pain and puffiness (swelling).  HOME CARE   · Only take medicines as told by your doctor.  · Take your medicine (antibiotics) as told. Finish it even if you start to feel better.  GET HELP RIGHT AWAY IF:  · Your cyst is tender, red, or puffy.  · You are not getting better, or you are getting worse.  · You have any questions or concerns.  MAKE SURE YOU:  · Understand these instructions.  · Will watch your condition.  · Will get help right away if you are not doing well or get worse.  Document Released: 08/17/2004 Document Revised: 01/09/2012 Document Reviewed: 01/16/2011  ExitCare® Patient Information ©2014 [employer].

## 2013-10-08 NOTE — ED Notes (Signed)
Pt c/o knot behind the rt ear.

## 2013-10-10 NOTE — ED Provider Notes (Signed)
CSN: [id number]     Arrival date & time 10/08/13  1900 History   First MD Initiated Contact with Patient 10/08/13 1958     Chief Complaint  Patient presents with  . Abscess     (Consider location/radiation/quality/duration/timing/severity/associated sxs/prior Treatment) Patient is a 38 y.o. female presenting with abscess. The history is provided by the patient.  Abscess Location:  Head/neck Head/neck abscess location:  R ear Size:  1 cm Abscess quality: induration and painful   Abscess quality: not draining, no fluctuance, no itching, no redness and not weeping   Red streaking: no   Duration:  2 days Progression:  Unchanged Pain details:    Quality:  Throbbing   Severity:  Moderate   Timing:  Constant   Progression:  Unchanged Chronicity:  Recurrent Context: not diabetes and not immunosuppression   Relieved by:  Nothing Worsened by:  Nothing tried Ineffective treatments:  None tried Associated symptoms: no fever, no headaches, no nausea and no vomiting   Risk factors: prior abscess     Past Medical History  Diagnosis Date  . Asthma   . Bronchitis    Past Surgical History  Procedure Laterality Date  . Cholecystectomy    . Tubal ligation     History reviewed. No pertinent family history. History  Substance Use Topics  . Smoking status: Current Every Day Smoker -- 0.50 packs/day    Types: Cigarettes  . Smokeless tobacco: Never Used  . Alcohol Use: No   OB History   Grav Para Term Preterm Abortions TAB SAB Ect Mult Living   2 2 2             Review of Systems  Constitutional: Negative for fever and chills.  Gastrointestinal: Negative for nausea and vomiting.  Musculoskeletal: Negative for arthralgias and joint swelling.  Skin: Positive for color change.       Abscess   Neurological: Negative for headaches.  Hematological: Negative for adenopathy.  All other systems reviewed and are negative.      Allergies  Codeine; Nsaids; Pineapple; Darvocet;  Toradol; and Tramadol  Home Medications   Current Outpatient Rx  Name  Route  Sig  Dispense  Refill  . albuterol (PROVENTIL HFA;VENTOLIN HFA) 108 (90 BASE) MCG/ACT inhaler   Inhalation   Inhale 2 puffs into the lungs every 6 (six) hours as needed for wheezing or shortness of breath.         . doxycycline (VIBRAMYCIN) 100 MG capsule   Oral   Take 1 capsule (100 mg total) by mouth 2 (two) times daily.   20 capsule   0   . HYDROcodone-acetaminophen (NORCO/VICODIN) 5-325 MG per tablet      Take one-two tabs po q 4-6 hrs prn pain   10 tablet   0   . mupirocin ointment (BACTROBAN) 2 %      Apply small amount behind the right ear TID x 10 days   22 g   0    BP 128/76  Pulse 79  Temp(Src) 98.3 F (36.8 C) (Oral)  Resp 20  Ht 5\' 7"  (1.702 m)  Wt 200 lb (90.719 kg)  BMI 31.32 kg/m2  SpO2 99%  LMP 10/01/2013 Physical Exam  Nursing note and vitals reviewed. Constitutional: She is oriented to person, place, and time. She appears well-developed and well-nourished. No distress.  HENT:  Head: Normocephalic and atraumatic.  Right Ear: Tympanic membrane, external ear and ear canal normal. No mastoid tenderness.  Left Ear: Tympanic membrane, external ear  and ear canal normal. No mastoid tenderness.  Mouth/Throat: Oropharynx is clear and moist.  1 cm indurated, flesh colored papule to the posterior auricle.  No drainage or fluctuance  Neck: Normal range of motion. Neck supple.  Cardiovascular: Normal rate, regular rhythm and normal heart sounds.   No murmur heard. Pulmonary/Chest: Effort normal and breath sounds normal. No respiratory distress.  Lymphadenopathy:    She has no cervical adenopathy.  Neurological: She is alert and oriented to person, place, and time. She exhibits normal muscle tone. Coordination normal.  Skin: Skin is warm and dry.    ED Course  Procedures (including critical care time) Labs Review Labs Reviewed - No data to display Imaging Review No results  found.   EKG Interpretation None      MDM   Final diagnoses:  Sebaceous cyst of ear     Pt with recurrent sebaceous cyst to posterior right auricle.  I&D not indicated at this time.  Pt agrees to warm compresses, doxy , vicodin #10 and bactroban cream.    VSS. pt appears stable for discharge.   Carmello Cabiness L. Trisha Mangle, PA-C 10/10/13 1149

## 2013-10-11 NOTE — ED Provider Notes (Signed)
Medical screening examination/treatment/procedure(s) were performed by non-physician practitioner and as supervising physician I was immediately available for consultation/collaboration.   EKG Interpretation None       Donnetta HutchingBrian Ryker [place], MD 10/11/13 [phone]

## 2013-10-17 ENCOUNTER — Emergency Department (HOSPITAL_COMMUNITY)
Admission: EM | Admit: 2013-10-17 | Discharge: 2013-10-18 | Disposition: A | Discharge status: Home / Self Care | Payer: Medicaid Other | Attending: Emergency Medicine | Admitting: Emergency Medicine

## 2013-10-17 ENCOUNTER — Encounter (HOSPITAL_COMMUNITY): Payer: Self-pay | Admitting: Emergency Medicine

## 2013-10-17 DIAGNOSIS — F17200 Nicotine dependence, unspecified, uncomplicated: Secondary | ICD-10-CM | POA: Insufficient documentation

## 2013-10-17 DIAGNOSIS — Z79899 Other long term (current) drug therapy: Secondary | ICD-10-CM | POA: Insufficient documentation

## 2013-10-17 DIAGNOSIS — R51 Headache: Secondary | ICD-10-CM | POA: Insufficient documentation

## 2013-10-17 DIAGNOSIS — R0789 Other chest pain: Secondary | ICD-10-CM

## 2013-10-17 DIAGNOSIS — J45909 Unspecified asthma, uncomplicated: Secondary | ICD-10-CM

## 2013-10-17 DIAGNOSIS — J45901 Unspecified asthma with (acute) exacerbation: Secondary | ICD-10-CM | POA: Insufficient documentation

## 2013-10-17 DIAGNOSIS — Z792 Long term (current) use of antibiotics: Secondary | ICD-10-CM | POA: Insufficient documentation

## 2013-10-17 DIAGNOSIS — R10819 Abdominal tenderness, unspecified site: Secondary | ICD-10-CM | POA: Insufficient documentation

## 2013-10-17 DIAGNOSIS — R071 Chest pain on breathing: Secondary | ICD-10-CM | POA: Insufficient documentation

## 2013-10-17 DIAGNOSIS — F411 Generalized anxiety disorder: Secondary | ICD-10-CM | POA: Insufficient documentation

## 2013-10-17 DIAGNOSIS — R1110 Vomiting, unspecified: Secondary | ICD-10-CM | POA: Insufficient documentation
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
## 2013-10-18 LAB — CBC WITH DIFFERENTIAL/PLATELET
Basophils Absolute: 0 10*3/uL (ref 0.0–0.1)
Basophils Relative: 0 % (ref 0–1)
Eosinophils Absolute: 0.1 10*3/uL (ref 0.0–0.7)
Eosinophils Relative: 1 % (ref 0–5)
HCT: 46.6 % — ABNORMAL HIGH (ref 36.0–46.0)
Hemoglobin: 15.8 g/dL — ABNORMAL HIGH (ref 12.0–15.0)
LYMPHS ABS: 2.4 10*3/uL (ref 0.7–4.0)
LYMPHS PCT: 21 % (ref 12–46)
MCH: 29.9 pg (ref 26.0–34.0)
MCHC: 33.9 g/dL (ref 30.0–36.0)
MCV: 88.3 fL (ref 78.0–100.0)
Monocytes Absolute: 0.8 10*3/uL (ref 0.1–1.0)
Monocytes Relative: 7 % (ref 3–12)
NEUTROS PCT: 71 % (ref 43–77)
Neutro Abs: 8 10*3/uL — ABNORMAL HIGH (ref 1.7–7.7)
PLATELETS: 247 10*3/uL (ref 150–400)
RBC: 5.28 MIL/uL — AB (ref 3.87–5.11)
RDW: 12.6 % (ref 11.5–15.5)
WBC: 11.3 10*3/uL — ABNORMAL HIGH (ref 4.0–10.5)

## 2013-10-18 LAB — BASIC METABOLIC PANEL
BUN: 14 mg/dL (ref 6–23)
CO2: 29 meq/L (ref 19–32)
Calcium: 9.6 mg/dL (ref 8.4–10.5)
Chloride: 103 mEq/L (ref 96–112)
Creatinine, Ser: 0.76 mg/dL (ref 0.50–1.10)
GFR calc Af Amer: >90 mL/min (ref >90)
GFR calc non Af Amer: >90 mL/min (ref >90)
GLUCOSE: 111 mg/dL — AB (ref 70–99)
POTASSIUM: 4.1 meq/L (ref 3.7–5.3)
Sodium: 142 mEq/L (ref 137–147)

## 2013-10-18 LAB — TROPONIN I: Troponin I: <0.3 ng/mL (ref <0.30)

## 2013-10-18 MED ORDER — IPRATROPIUM-ALBUTEROL 0.5-2.5 (3) MG/3ML IN SOLN
3.0000 mL | Freq: Once | RESPIRATORY_TRACT | Status: AC
  Administered 2013-10-18: 3 mL via RESPIRATORY_TRACT
  Filled 2013-10-18: qty 3

## 2013-10-18 MED ORDER — ACETAMINOPHEN 500 MG PO TABS
1000.0000 mg | ORAL_TABLET | Freq: Once | ORAL | Status: AC
  Administered 2013-10-18: 1000 mg via ORAL
  Filled 2013-10-18: qty 2

## 2013-10-18 MED ORDER — ONDANSETRON HCL 4 MG/2ML IJ SOLN
4.0000 mg | Freq: Once | INTRAMUSCULAR | Status: AC
  Administered 2013-10-18: 4 mg via INTRAVENOUS
  Filled 2013-10-18: qty 2

## 2013-10-18 MED ORDER — SODIUM CHLORIDE 0.9 % IV BOLUS (SEPSIS)
1000.0000 mL | Freq: Once | INTRAVENOUS | Status: AC
  Administered 2013-10-18: 1000 mL via INTRAVENOUS

## 2013-10-18 NOTE — ED Notes (Signed)
Patient states that her headache is easing up but she is still having some chest discomfort. States that she does not have a ride home that she would have to walk [address]

## 2013-10-18 NOTE — ED Notes (Signed)
Gave patient drink as requested.  

## 2013-11-04 ENCOUNTER — Emergency Department (HOSPITAL_COMMUNITY)
Admission: EM | Admit: 2013-11-04 | Discharge: 2013-11-04 | Disposition: A | Discharge status: Home / Self Care | Payer: Medicaid Other | Attending: Emergency Medicine | Admitting: Emergency Medicine

## 2013-11-04 ENCOUNTER — Encounter (HOSPITAL_COMMUNITY): Payer: Self-pay | Admitting: Emergency Medicine

## 2013-11-04 ENCOUNTER — Emergency Department (HOSPITAL_COMMUNITY): Payer: Medicaid Other

## 2013-11-04 DIAGNOSIS — Z79899 Other long term (current) drug therapy: Secondary | ICD-10-CM | POA: Insufficient documentation

## 2013-11-04 DIAGNOSIS — Y92410 Unspecified street and highway as the place of occurrence of the external cause: Secondary | ICD-10-CM | POA: Insufficient documentation

## 2013-11-04 DIAGNOSIS — S7002XA Contusion of left hip, initial encounter: Secondary | ICD-10-CM

## 2013-11-04 DIAGNOSIS — S8010XA Contusion of unspecified lower leg, initial encounter: Secondary | ICD-10-CM | POA: Insufficient documentation

## 2013-11-04 DIAGNOSIS — Y9389 Activity, other specified: Secondary | ICD-10-CM | POA: Insufficient documentation

## 2013-11-04 DIAGNOSIS — S161XXA Strain of muscle, fascia and tendon at neck level, initial encounter: Secondary | ICD-10-CM

## 2013-11-04 DIAGNOSIS — S7001XA Contusion of right hip, initial encounter: Secondary | ICD-10-CM

## 2013-11-04 DIAGNOSIS — J45909 Unspecified asthma, uncomplicated: Secondary | ICD-10-CM | POA: Insufficient documentation

## 2013-11-04 DIAGNOSIS — Z792 Long term (current) use of antibiotics: Secondary | ICD-10-CM | POA: Insufficient documentation

## 2013-11-04 DIAGNOSIS — IMO0002 Reserved for concepts with insufficient information to code with codable children: Secondary | ICD-10-CM

## 2013-11-04 DIAGNOSIS — S39012A Strain of muscle, fascia and tendon of lower back, initial encounter: Secondary | ICD-10-CM

## 2013-11-04 DIAGNOSIS — S139XXA Sprain of joints and ligaments of unspecified parts of neck, initial encounter: Secondary | ICD-10-CM | POA: Insufficient documentation

## 2013-11-04 DIAGNOSIS — F17200 Nicotine dependence, unspecified, uncomplicated: Secondary | ICD-10-CM | POA: Insufficient documentation

## 2013-11-04 DIAGNOSIS — S335XXA Sprain of ligaments of lumbar spine, initial encounter: Secondary | ICD-10-CM | POA: Insufficient documentation

## 2013-11-04 MED ORDER — HYDROCODONE-ACETAMINOPHEN 5-325 MG PO TABS: 2.0000 | ORAL_TABLET | Freq: Four times a day (QID) | ORAL | Status: DC | PRN

## 2013-11-04 MED ORDER — FENTANYL CITRATE 0.05 MG/ML IJ SOLN
100.0000 ug | Freq: Once | INTRAMUSCULAR | Status: AC
  Administered 2013-11-04: 100 ug via NASAL
  Filled 2013-11-04: qty 2

## 2013-11-04 NOTE — Discharge Instructions (Signed)
SEEK IMMEDIATE MEDICAL ATTENTION IF: New numbness, tingling, weakness, or problem with the use of your arms or legs.  Severe back pain not relieved with medications.  Change in bowel or bladder control.  Increasing pain in any areas of the body (such as chest or abdominal pain).  Shortness of breath, dizziness or fainting.  Nausea (feeling sick to your stomach), vomiting, fever, or sweats. You have neck pain, possibly from a cervical strain and/or pinched nerve.  SEEK IMMEDIATE MEDICAL ATTENTION IF: You develop difficulties swallowing or breathing.  You have new or worse numbness, weakness, tingling, or movement problems in your arms or legs.  You develop increasing pain which is uncontrolled with medications.  You have change in bowel or bladder function, or other concerns.
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
## 2014-05-14 ENCOUNTER — Encounter (HOSPITAL_COMMUNITY): Payer: Self-pay | Admitting: Emergency Medicine

## 2014-05-14 ENCOUNTER — Emergency Department (HOSPITAL_COMMUNITY)
Admission: EM | Admit: 2014-05-14 | Discharge: 2014-05-14 | Disposition: A | Discharge status: Home / Self Care | Payer: Medicaid Other | Attending: Emergency Medicine | Admitting: Emergency Medicine

## 2014-05-14 DIAGNOSIS — K088 Other specified disorders of teeth and supporting structures: Secondary | ICD-10-CM | POA: Insufficient documentation

## 2014-05-14 DIAGNOSIS — Z79899 Other long term (current) drug therapy: Secondary | ICD-10-CM | POA: Insufficient documentation

## 2014-05-14 DIAGNOSIS — J45909 Unspecified asthma, uncomplicated: Secondary | ICD-10-CM | POA: Insufficient documentation

## 2014-05-14 DIAGNOSIS — Z720 Tobacco use: Secondary | ICD-10-CM | POA: Diagnosis not present

## 2014-05-14 DIAGNOSIS — K0889 Other specified disorders of teeth and supporting structures: Secondary | ICD-10-CM

## 2014-05-14 MED ORDER — AMOXICILLIN 500 MG PO CAPS: 500.0000 mg | ORAL_CAPSULE | Freq: Three times a day (TID) | ORAL | Status: DC

## 2014-05-14 MED ORDER — HYDROCODONE-ACETAMINOPHEN 5-325 MG PO TABS: 1.0000 | ORAL_TABLET | ORAL | Status: DC | PRN

## 2014-05-14 MED ORDER — AMOXICILLIN 250 MG PO CAPS
500.0000 mg | ORAL_CAPSULE | Freq: Once | ORAL | Status: AC
  Administered 2014-05-14: 500 mg via ORAL
  Filled 2014-05-14: qty 2

## 2014-05-14 MED ORDER — HYDROCODONE-ACETAMINOPHEN 5-325 MG PO TABS
1.0000 | ORAL_TABLET | Freq: Once | ORAL | Status: AC
  Administered 2014-05-14: 1 via ORAL
  Filled 2014-05-14: qty 1

## 2014-05-14 NOTE — ED Notes (Signed)
Pt c/o dental pain that located right upper dental area that started last week,

## 2014-05-14 NOTE — ED Provider Notes (Signed)
Medical screening examination/treatment/procedure(s) were performed by non-physician practitioner and as supervising physician I was immediately available for consultation/collaboration.   EKG Interpretation None        Elfreida Heggs J. Deairra Halleck, MD 05/14/14 2326 

## 2014-05-14 NOTE — ED Provider Notes (Signed)
CSN: [id number]     Arrival date & time 05/14/14  2113 History   First MD Initiated Contact with Patient 05/14/14 2123     Chief Complaint  Patient presents with  . Dental Pain     (Consider location/radiation/quality/duration/timing/severity/associated sxs/prior Treatment) Patient is a 38 y.o. female presenting with tooth pain. The history is provided by the patient.  Dental Pain Location:  Upper Quality:  Aching Severity:  Moderate Onset quality:  Gradual Duration:  1 week Timing:  Intermittent Progression:  Worsening Chronicity:  Chronic Context: dental caries and poor dentition   Relieved by:  Nothing Ineffective treatments: BC powders. Associated symptoms: gum swelling   Associated symptoms: no drooling, no facial swelling, no neck pain and no trismus   Risk factors: smoking     Past Medical History  Diagnosis Date  . Asthma   . Bronchitis    Past Surgical History  Procedure Laterality Date  . Cholecystectomy    . Tubal ligation     No family history on file. History  Substance Use Topics  . Smoking status: Current Every Day Smoker -- 0.50 packs/day    Types: Cigarettes  . Smokeless tobacco: Never Used  . Alcohol Use: No   OB History   Grav Para Term Preterm Abortions TAB SAB Ect Mult Living   2 2 2             Review of Systems  Constitutional: Negative for activity change.       All ROS Neg except as noted in HPI  HENT: Positive for dental problem. Negative for drooling and facial swelling.   Eyes: Negative for photophobia and discharge.  Respiratory: Negative for cough, shortness of breath and wheezing.   Cardiovascular: Negative for chest pain and palpitations.  Gastrointestinal: Negative for abdominal pain and blood in stool.  Genitourinary: Negative for dysuria, frequency and hematuria.  Musculoskeletal: Negative for arthralgias, back pain and neck pain.  Skin: Negative.   Neurological: Negative for dizziness, seizures and speech difficulty.    Psychiatric/Behavioral: Negative for hallucinations and confusion.      Allergies  Codeine; Nsaids; Pineapple; Sulfa antibiotics; Darvocet; Toradol; and Tramadol  Home Medications   Prior to Admission medications   Medication Sig Start Date End Date Taking? Authorizing Provider  albuterol (PROVENTIL HFA;VENTOLIN HFA) 108 (90 BASE) MCG/ACT inhaler Inhale 2 puffs into the lungs every 6 (six) hours as needed for wheezing or shortness of breath.    Historical Provider, MD  Aspirin-Salicylamide-Caffeine (BC HEADACHE) 325-95-16 MG TABS Take 1 packet by mouth daily as needed (for pain).    Historical Provider, MD  HYDROcodone-acetaminophen (NORCO) 5-325 MG per tablet Take 2 tablets by mouth every 6 (six) hours as needed for severe pain. 11/04/13   Hurman HornJohn M Bednar, MD   BP 132/93  Pulse 92  Temp(Src) 98.5 F (36.9 C) (Oral)  Resp 17  Ht 5\' 6"  (1.676 m)  Wt 200 lb (90.719 kg)  BMI 32.30 kg/m2  SpO2 100% Physical Exam  Nursing note and vitals reviewed. Constitutional: She is oriented to person, place, and time. She appears well-developed and well-nourished.  Non-toxic appearance.  HENT:  Head: Normocephalic.  Right Ear: Tympanic membrane and external ear normal.  Left Ear: Tympanic membrane and external ear normal.  Mouth/Throat:    No abscess. Airway patent. No swelling under the tongue.  Eyes: EOM and lids are normal. Pupils are equal, round, and reactive to light.  Neck: Normal range of motion. Neck supple. Carotid bruit is not present.  Cardiovascular: Normal rate, regular rhythm, normal heart sounds, intact distal pulses and normal pulses.   Pulmonary/Chest: Breath sounds normal. No respiratory distress.  Abdominal: Soft. Bowel sounds are normal. There is no tenderness. There is no guarding.  Musculoskeletal: Normal range of motion.  Lymphadenopathy:       Head (right side): No submandibular adenopathy present.       Head (left side): No submandibular adenopathy present.     She has no cervical adenopathy.  Neurological: She is alert and oriented to person, place, and time. She has normal strength. No cranial nerve deficit or sensory deficit.  Skin: Skin is warm and dry.  Psychiatric: She has a normal mood and affect. Her speech is normal.    ED Course  Procedures (including critical care time) Labs Review Labs Reviewed - No data to display  Imaging Review No results found.   EKG Interpretation None      MDM  Pt  To see a dentist as soon as possible. Rx for amoxil and norco g iven a  to the patient.   Final diagnoses:  Toothache    **I have reviewed nursing notes, vital signs, and all appropriate lab and imaging results for this patient.Kathie Dike*    Shanyia Stines M Kyrie Fludd, PA-C 05/14/14 2222

## 2014-05-14 NOTE — Discharge Instructions (Signed)
It is important that you see a dentist as soon as possible.  Please use amoxil daily with food until all taken. Use tylenol for mild pain. Use norco for more severe pain. Please see your [place] [employer] access physician for additional management and pain control of your tooth. Dental Pain Toothache is pain in or around a tooth. It may get worse with chewing or with cold or heat.  HOME CARE  Your dentist may use a numbing medicine during treatment. If so, you may need to avoid eating until the medicine wears off. Ask your dentist about this.  Only take medicine as told by your dentist or doctor.  Avoid chewing food near the painful tooth until after all treatment is done. Ask your dentist about this. GET HELP RIGHT AWAY IF:   The problem gets worse or new problems appear.  You have a fever.  There is redness and puffiness (swelling) of the face, jaw, or neck.  You cannot open your mouth.  There is pain in the jaw.  There is very bad pain that is not helped by medicine. MAKE SURE YOU:   Understand these instructions.  Will watch your condition.  Will get help right away if you are not doing well or get worse. Document Released: 12/27/2007 Document Revised: 10/02/2011 Document Reviewed: 12/27/2007 [facility] Patient Information 2015 [place]. This information is not intended to replace advice given to you by your health care provider. Make sure you discuss any questions you have with your health care provider.

## 2014-05-25 ENCOUNTER — Encounter (HOSPITAL_COMMUNITY): Payer: Self-pay | Admitting: Emergency Medicine

## 2014-09-14 ENCOUNTER — Emergency Department (HOSPITAL_COMMUNITY)
Admission: EM | Admit: 2014-09-14 | Discharge: 2014-09-14 | Disposition: A | Discharge status: Home / Self Care | Payer: Medicaid Other | Attending: Emergency Medicine | Admitting: Emergency Medicine

## 2014-09-14 ENCOUNTER — Emergency Department (HOSPITAL_COMMUNITY): Payer: Medicaid Other

## 2014-09-14 ENCOUNTER — Encounter (HOSPITAL_COMMUNITY): Payer: Self-pay | Admitting: Emergency Medicine

## 2014-09-14 DIAGNOSIS — Z720 Tobacco use: Secondary | ICD-10-CM | POA: Insufficient documentation

## 2014-09-14 DIAGNOSIS — Z7982 Long term (current) use of aspirin: Secondary | ICD-10-CM | POA: Insufficient documentation

## 2014-09-14 DIAGNOSIS — J45909 Unspecified asthma, uncomplicated: Secondary | ICD-10-CM | POA: Diagnosis not present

## 2014-09-14 DIAGNOSIS — Z79899 Other long term (current) drug therapy: Secondary | ICD-10-CM | POA: Insufficient documentation

## 2014-09-14 DIAGNOSIS — M546 Pain in thoracic spine: Secondary | ICD-10-CM

## 2014-09-14 DIAGNOSIS — M549 Dorsalgia, unspecified: Secondary | ICD-10-CM

## 2014-09-14 MED ORDER — CYCLOBENZAPRINE HCL 10 MG PO TABS: 10.0000 mg | ORAL_TABLET | Freq: Two times a day (BID) | ORAL | Status: DC | PRN

## 2014-09-14 MED ORDER — HYDROCODONE-ACETAMINOPHEN 5-325 MG PO TABS: 1.0000 | ORAL_TABLET | ORAL | Status: DC | PRN

## 2014-09-14 NOTE — ED Notes (Signed)
Pt c/o middle back pain since falling out of bed three days ago.

## 2014-09-14 NOTE — ED Notes (Signed)
Pain T spine after fell out of bed, 3 days ago. Alert, NAD,

## 2014-09-14 NOTE — ED Provider Notes (Signed)
CSN: [id number]     Arrival date & time 09/14/14  2037 History   First MD Initiated Contact with Patient 09/14/14 2120     Chief Complaint  Patient presents with  . Back Pain     (Consider location/radiation/quality/duration/timing/severity/associated sxs/prior Treatment) Patient is a 39 y.o. female presenting with back pain. The history is provided by the patient.  Back Pain Location:  Thoracic spine Quality:  Aching and stabbing Radiates to:  Does not radiate Pain severity:  Severe Onset quality:  Sudden  [name] is a 39 y.o. female who presents to the ED with mid back pain after she fell out of bed three days ago. She denies any other injuries.   Past Medical History  Diagnosis Date  . Asthma   . Bronchitis    Past Surgical History  Procedure Laterality Date  . Cholecystectomy    . Tubal ligation     History reviewed. No pertinent family history. History  Substance Use Topics  . Smoking status: Current Every Day Smoker -- 0.50 packs/day    Types: Cigarettes  . Smokeless tobacco: Never Used  . Alcohol Use: No   OB History    Gravida Para Term Preterm AB TAB SAB Ectopic Multiple Living   2 2 2             Review of Systems  Musculoskeletal: Positive for back pain.  all other systems negative    Allergies  Codeine; Nsaids; Pineapple; Sulfa antibiotics; Darvocet; Toradol; and Tramadol  Home Medications   Prior to Admission medications   Medication Sig Start Date End Date Taking? Authorizing Provider  albuterol (PROVENTIL HFA;VENTOLIN HFA) 108 (90 BASE) MCG/ACT inhaler Inhale 2 puffs into the lungs every 6 (six) hours as needed for wheezing or shortness of breath.    Historical Provider, MD  amoxicillin (AMOXIL) 500 MG capsule Take 1 capsule (500 mg total) by mouth 3 (three) times daily. 05/14/14   Kathie DikeHobson M Bryant, PA-C  Aspirin-Salicylamide-Caffeine (BC HEADACHE) 810-715-8103325-95-16 MG TABS Take 1 packet by mouth daily as needed (for pain).    Historical Provider,  MD  cyclobenzaprine (FLEXERIL) 10 MG tablet Take 1 tablet (10 mg total) by mouth 2 (two) times daily as needed for muscle spasms. 09/14/14   Hope Orlene OchM Neese, NP  HYDROcodone-acetaminophen (NORCO/VICODIN) 5-325 MG per tablet Take 1 tablet by mouth every 4 (four) hours as needed. 09/14/14   Hope Orlene OchM Neese, NP   BP 143/61 mmHg  Pulse 86  Temp(Src) 98.7 F (37.1 C) (Oral)  Resp 20  Ht 5\' 7"  (1.702 m)  Wt 200 lb (90.719 kg)  BMI 31.32 kg/m2  SpO2 100%  LMP 08/15/2014 Physical Exam  Constitutional: She is oriented to person, place, and time. She appears well-developed and well-nourished. No distress.  HENT:  Head: Normocephalic and atraumatic.  Right Ear: Tympanic membrane normal.  Left Ear: Tympanic membrane normal.  Nose: Nose normal.  Mouth/Throat: Uvula is midline, oropharynx is clear and moist and mucous membranes are normal.  Eyes: EOM are normal.  Neck: Normal range of motion. Neck supple.  Cardiovascular: Normal rate and regular rhythm.   Pulmonary/Chest: Effort normal. She has no wheezes. She has no rales.  Abdominal: Soft. Bowel sounds are normal. There is no tenderness.  Musculoskeletal: Normal range of motion.       Thoracic back: She exhibits tenderness, bony tenderness and spasm. She exhibits no laceration and normal pulse. Decreased range of motion: due to pain.       Back:  Neurological: She is alert and oriented to person, place, and time. She has normal strength. No cranial nerve deficit or sensory deficit. Gait normal.  Reflex Scores:      Bicep reflexes are 2+ on the right side and 2+ on the left side.      Brachioradialis reflexes are 2+ on the right side and 2+ on the left side.      Patellar reflexes are 2+ on the right side and 2+ on the left side.      Achilles reflexes are 2+ on the right side and 2+ on the left side. Skin: Skin is warm and dry.  Psychiatric: She has a normal mood and affect. Her behavior is normal.  Nursing note and vitals reviewed.   ED Course   Procedures (  MDM  39 y.o. female with thoracic pain s/p fall 3 days ago. Stable for d/c without neuro deficits. Will treat for pain and muscle spasm. She will follow up with her PCP or return here as needed.  Final diagnoses:  Bilateral thoracic back pain       [facility], NP 09/15/14 1610  Glynn Octave, MD 09/15/14 1105

## 2015-04-01 ENCOUNTER — Emergency Department (HOSPITAL_COMMUNITY): Payer: Medicaid Other

## 2015-04-01 ENCOUNTER — Emergency Department (HOSPITAL_COMMUNITY)
Admission: EM | Admit: 2015-04-01 | Discharge: 2015-04-01 | Disposition: A | Discharge status: Home / Self Care | Payer: Medicaid Other | Attending: Emergency Medicine | Admitting: Emergency Medicine

## 2015-04-01 ENCOUNTER — Encounter (HOSPITAL_COMMUNITY): Payer: Self-pay | Admitting: Emergency Medicine

## 2015-04-01 DIAGNOSIS — S92352S Displaced fracture of fifth metatarsal bone, left foot, sequela: Secondary | ICD-10-CM | POA: Diagnosis not present

## 2015-04-01 DIAGNOSIS — Z792 Long term (current) use of antibiotics: Secondary | ICD-10-CM | POA: Insufficient documentation

## 2015-04-01 DIAGNOSIS — S92302S Fracture of unspecified metatarsal bone(s), left foot, sequela: Secondary | ICD-10-CM

## 2015-04-01 DIAGNOSIS — Z720 Tobacco use: Secondary | ICD-10-CM | POA: Insufficient documentation

## 2015-04-01 DIAGNOSIS — J45909 Unspecified asthma, uncomplicated: Secondary | ICD-10-CM | POA: Diagnosis not present

## 2015-04-01 DIAGNOSIS — S99922S Unspecified injury of left foot, sequela: Secondary | ICD-10-CM | POA: Diagnosis present

## 2015-04-01 DIAGNOSIS — X58XXXS Exposure to other specified factors, sequela: Secondary | ICD-10-CM | POA: Diagnosis not present

## 2015-04-01 DIAGNOSIS — Z79899 Other long term (current) drug therapy: Secondary | ICD-10-CM | POA: Diagnosis not present

## 2015-04-01 NOTE — ED Notes (Signed)
PT c/o left foot pain since 03/12/15 with dx of fracture per pt at [facility] ED and stated was seen at orthopedic office and is present in ED at this time with CAM walker to foot. PT states she came to this ED for second opinion.

## 2015-04-01 NOTE — ED Notes (Signed)
Pt left without her d/c instructions and without signing for d/c

## 2015-04-01 NOTE — ED Provider Notes (Signed)
CSN: [id number]     Arrival date & time 04/01/15  1429 History   None    Chief Complaint  Patient presents with  . Foot Pain     (Consider location/radiation/quality/duration/timing/severity/associated sxs/prior Treatment) Patient is a 39 y.o. female presenting with lower extremity pain. The history is provided by the patient. No language interpreter was used.  Foot Pain This is a recurrent problem. The current episode started 1 to 4 weeks ago. The problem occurs constantly. The problem has been gradually worsening. Associated symptoms include arthralgias. Nothing aggravates the symptoms. She has tried nothing for the symptoms.  Pt reports she fracture her foot 8/19.  She reports she saw Dr. Terrilee Croak who told her she did not need surgery.  Pt reports worse pain.    Past Medical History  Diagnosis Date  . Asthma   . Bronchitis    Past Surgical History  Procedure Laterality Date  . Cholecystectomy    . Tubal ligation     History reviewed. No pertinent family history. Social History  Substance Use Topics  . Smoking status: Current Every Day Smoker -- 0.50 packs/day    Types: Cigarettes  . Smokeless tobacco: Never Used  . Alcohol Use: No   OB History    Gravida Para Term Preterm AB TAB SAB Ectopic Multiple Living   Review of Systems  Musculoskeletal: Positive for arthralgias.  All other systems reviewed and are negative.     Allergies  Codeine; Nsaids; Pineapple; Sulfa antibiotics; Darvocet; Toradol; and Tramadol  Home Medications   Prior to Admission medications   Medication Sig Start Date End Date Taking? Authorizing Provider  albuterol (PROVENTIL HFA;VENTOLIN HFA) 108 (90 BASE) MCG/ACT inhaler Inhale 2 puffs into the lungs every 6 (six) hours as needed for wheezing or shortness of breath.    Historical Provider, MD  amoxicillin (AMOXIL) 500 MG capsule Take 1 capsule (500 mg total) by mouth 3 (three) times daily. 05/14/14   Ivery Quale, PA-C   Aspirin-Salicylamide-Caffeine (BC HEADACHE) 325-95-16 MG TABS Take 1 packet by mouth daily as needed (for pain).    Historical Provider, MD  cyclobenzaprine (FLEXERIL) 10 MG tablet Take 1 tablet (10 mg total) by mouth 2 (two) times daily as needed for muscle spasms. 09/14/14   Hope Orlene Och, NP  HYDROcodone-acetaminophen (NORCO/VICODIN) 5-325 MG per tablet Take 1 tablet by mouth every 4 (four) hours as needed. 09/14/14   Hope Orlene Och, NP   BP 121/74 mmHg  Pulse 80  Temp(Src) 98.2 F (36.8 C) (Oral)  Resp 18  Ht  (1.702 m)  Wt 200 lb (90.719 kg)  BMI 31.32 kg/m2  SpO2 100%  LMP 03/11/2015 Physical Exam  Constitutional: She is oriented to person, place, and time. She appears well-developed and well-nourished.  HENT:  Head: Normocephalic.  Eyes: EOM are normal.  Neck: Normal range of motion.  Abdominal: She exhibits no distension.  Musculoskeletal: Normal range of motion. She exhibits tenderness.  Tender area metatarsal area, pain with movement.  nv and ns intact  Neurological: She is alert and oriented to person, place, and time.  Psychiatric: She has a normal mood and affect.  Nursing note and vitals reviewed.   ED Course  Procedures (including critical care time) Labs Review Labs Reviewed - No data to display  Imaging Review Dg Foot Complete Left  04/01/2015   CLINICAL DATA:  Follow-up fracture of the left foot  EXAM: LEFT  FOOT - COMPLETE 3+ VIEW  COMPARISON:  03/30/2015, 03/15/2015 and 03/12/2015.  FINDINGS: Again noted displaced fracture at the base of fifth metatarsal without change in alignment from prior exam. There is no callus formation.  IMPRESSION: Again noted displaced fracture at the base of fifth metatarsal without change in alignment. No callus formation.   Electronically Signed   By: Natasha Mead M.D.   On: 04/01/2015 16:03   I have personally reviewed and evaluated these images and lab results as part of my medical decision-making.   EKG Interpretation None       MDM  Radiologist reported no change since xray 2 days ago.  (Pt told me she had not been xrayed since initial injury)  Pt advised I will give her referral information for Dr. Romeo Apple that she can see for follow up.    Pt requested Narcotic pain medication.  Pt has multiple allergies.   I advised discuss with primary care.  Pt became angry and stormed out of room.  Of note pt walking fast with no limp.    Final diagnoses:  Fracture of 5th metatarsal, left, sequela    Nanine Means cam walker tylenol    Elson Areas, PA-C 04/01/15 1645  Eber Hong, MD 04/02/15 1537

## 2015-04-01 NOTE — Discharge Instructions (Signed)
Metatarsal Fracture, Undisplaced  A metatarsal fracture is a break in the bone(s) of the foot. These are the bones of the foot that connect your toes to the bones of the ankle.  DIAGNOSIS   The diagnoses of these fractures are usually made with X-rays. If there are problems in the forefoot and x-rays are normal a later bone scan will usually make the diagnosis.   TREATMENT AND HOME CARE INSTRUCTIONS  · Treatment may or may not include a cast or walking shoe. When casts are needed the use is usually for short periods of time so as not to slow down healing with muscle wasting (atrophy).  · Activities should be stopped until further advised by your caregiver.  · Wear shoes with adequate shock absorbing capabilities and stiff soles.  · Alternative exercise may be undertaken while waiting for healing. These may include bicycling and swimming, or as your caregiver suggests.  · It is important to keep all follow-up visits or specialty referrals. The failure to keep these appointments could result in improper bone healing and chronic pain or disability.  · Warning: Do not drive a car or operate a motor vehicle until your caregiver specifically tells you it is safe to do so.  IF YOU DO NOT HAVE A CAST OR SPLINT:  · You may walk on your injured foot as tolerated or advised.  · Do not put any weight on your injured foot for as long as directed by your caregiver. Slowly increase the amount of time you walk on the foot as the pain allows or as advised.  · Use crutches until you can bear weight without pain. A gradual increase in weight bearing may help.  · Apply ice to the injury for 15-20 minutes each hour while awake for the first 2 days. Put the ice in a plastic bag and place a towel between the bag of ice and your skin.  · Only take over-the-counter or prescription medicines for pain, discomfort, or fever as directed by your caregiver.  SEEK IMMEDIATE MEDICAL CARE IF:   · Your cast gets damaged or breaks.  · You have  continued severe pain or more swelling than you did before the cast was put on, or the pain is not controlled with medications.  · Your skin or nails below the injury turn blue or grey, or feel cold or numb.  · There is a bad smell, or new stains or pus-like (purulent) drainage coming from the cast.  MAKE SURE YOU:   · Understand these instructions.  · Will watch your condition.  · Will get help right away if you are not doing well or get worse.  Document Released: 04/01/2002 Document Revised: 10/02/2011 Document Reviewed: 02/21/2008  ExitCare® Patient Information ©2015 [employer]. This information is not intended to replace advice given to you by your health care provider. Make sure you discuss any questions you have with your health care provider.

## 2015-04-01 NOTE — ED Notes (Signed)
Pt walked out quickly when EDPA informed pt that she would not be getting any narcotics for pain. EDPA states pt became angered quickly as well

## 2016-10-29 ENCOUNTER — Emergency Department (HOSPITAL_COMMUNITY)
Admission: EM | Admit: 2016-10-29 | Discharge: 2016-10-29 | Disposition: A | Discharge status: Home / Self Care | Payer: Medicaid Other | Attending: Emergency Medicine | Admitting: Emergency Medicine

## 2016-10-29 ENCOUNTER — Encounter (HOSPITAL_COMMUNITY): Payer: Self-pay | Admitting: Emergency Medicine

## 2016-10-29 DIAGNOSIS — K0381 Cracked tooth: Secondary | ICD-10-CM | POA: Insufficient documentation

## 2016-10-29 DIAGNOSIS — J45909 Unspecified asthma, uncomplicated: Secondary | ICD-10-CM | POA: Insufficient documentation

## 2016-10-29 DIAGNOSIS — Z79899 Other long term (current) drug therapy: Secondary | ICD-10-CM | POA: Insufficient documentation

## 2016-10-29 DIAGNOSIS — F17210 Nicotine dependence, cigarettes, uncomplicated: Secondary | ICD-10-CM | POA: Diagnosis not present

## 2016-10-29 DIAGNOSIS — S025XXA Fracture of tooth (traumatic), initial encounter for closed fracture: Secondary | ICD-10-CM

## 2016-10-29 DIAGNOSIS — K0889 Other specified disorders of teeth and supporting structures: Secondary | ICD-10-CM

## 2016-10-29 MED ORDER — BENZOCAINE 20 % MT GEL: 1.0000 "application " | Freq: Three times a day (TID) | OROMUCOSAL | 0 refills | Status: DC | PRN

## 2016-10-29 MED ORDER — KETOROLAC TROMETHAMINE 30 MG/ML IJ SOLN
30.0000 mg | Freq: Once | INTRAMUSCULAR | Status: DC
  Filled 2016-10-29: qty 1

## 2016-10-29 MED ORDER — ACETAMINOPHEN 325 MG PO TABS
650.0000 mg | ORAL_TABLET | Freq: Once | ORAL | Status: AC
  Administered 2016-10-29: 650 mg via ORAL
  Filled 2016-10-29: qty 2

## 2016-10-29 MED ORDER — KETOROLAC TROMETHAMINE 30 MG/ML IJ SOLN: 30.0000 mg | Freq: Once | INTRAMUSCULAR | Status: DC

## 2016-10-29 NOTE — ED Provider Notes (Signed)
[facility]-EMERGENCY DEPT Provider Note   CSN: [id number] Arrival date & time: 10/29/16  1455  By signing my name below, I, [name], attest that this documentation has been prepared under the direction and in the presence of Sharen Heck, PA-C.  Electronically Signed: Diona Cooley, ED Scribe. 10/29/16. 3:36 PM.   History   Chief Complaint Chief Complaint  Patient presents with  . Dental Pain    HPI [name] is a 41 y.o. female who presents to the Emergency Department complaining of moderate, constant, upper right tooth pain that started last week. Pt notes she has been trying to find a new dentist because her current dentist left the business. Associated sx include decreased food intake secondary to pain. Pt notes using goody powder, BC powder, and ibuprofen without relief. Pt who normally smokes, notes she hasn't been because of the pain. Pt denies fever, or difficulty opening or closing her jaw. No drooling.    The history is provided by the patient. No language interpreter was used.    Past Medical History:  Diagnosis Date  . Asthma   . Bronchitis     There are no active problems to display for this patient.   Past Surgical History:  Procedure Laterality Date  . CHOLECYSTECTOMY    . TUBAL LIGATION      OB History    Gravida Para Term Preterm AB Living   SAB TAB Ectopic Multiple Live Births                   Home Medications    Prior to Admission medications   Medication Sig Start Date End Date Taking? Authorizing Provider  amoxicillin (AMOXIL) 500 MG capsule Take 1 capsule (500 mg total) by mouth 3 (three) times daily. Patient not taking: Reported on 04/01/2015 05/14/14   Ivery Quale, PA-C  Aspirin-Salicylamide-Caffeine [facility] HEADACHE) [phone] MG TABS Take 1 packet by mouth daily as needed (for pain).    Historical Provider, MD  benzocaine (HURRICAINE) 20 % GEL Use as directed 1 application in the mouth or throat 3 (three) times  daily as needed. 10/29/16   Liberty Handy, PA-C  cyclobenzaprine (FLEXERIL) 10 MG tablet Take 1 tablet (10 mg total) by mouth 2 (two) times daily as needed for muscle spasms. Patient not taking: Reported on 04/01/2015 09/14/14   Janne Napoleon, NP  HYDROcodone-acetaminophen (NORCO/VICODIN) 5-325 MG per tablet Take 1 tablet by mouth every 4 (four) hours as needed. Patient not taking: Reported on 04/01/2015 09/14/14   Janne Napoleon, NP    Family History History reviewed. No pertinent family history.  Social History Social History  Substance Use Topics  . Smoking status: Current Every Day Smoker    Packs/day: 0.50    Types: Cigarettes  . Smokeless tobacco: Never Used  . Alcohol use No     Allergies   Codeine; Nsaids; Pineapple; Sulfa antibiotics; Darvocet [propoxyphene n-acetaminophen]; Toradol [ketorolac tromethamine]; and Tramadol   Review of Systems Review of Systems  Constitutional: Negative for fever.  HENT: Positive for dental problem. Negative for drooling and facial swelling.   Eyes: Negative for pain and redness.     Physical Exam Updated Vital Signs BP (!) 122/98 (BP Location: Right Arm)   Pulse 91   Temp 98.6 F (37 C) (Oral)   Resp 18   Ht  (1.702 m)   Wt 200 lb (90.7 kg)   LMP 10/15/2016  SpO2 100%   BMI 31.32 kg/m   Physical Exam  Constitutional: She is oriented to person, place, and time. She appears well-developed and well-nourished. No distress.  HENT:  Head: Normocephalic and atraumatic.  Tooth 1 and 3 are cracked with mild tenderness.  Poor dentition throughout, build up of plaque along gingival edges.  Three left lower teeth are missing.  No facial or periorbital, erythema, edema, or tenderness. Uvula is midline. No trismus. No anterior neck edema. No neck rigidity, full, passive neck ROM with out pain.  No hot potato voice.  Neck: Neck supple.  Cardiovascular: Normal rate, regular rhythm and normal heart sounds.   No murmur  heard. Pulmonary/Chest: Effort normal and breath sounds normal. No respiratory distress. She has no wheezes. She has no rales.  Musculoskeletal: Normal range of motion.  Neurological: She is alert and oriented to person, place, and time.  Skin: Skin is warm and dry.  Nursing note and vitals reviewed.    ED Treatments / Results  DIAGNOSTIC STUDIES: Oxygen Saturation is 100% on RA, normal by my interpretation.   COORDINATION OF CARE: 3:30 PM-Discussed next steps with pt. Pt verbalized understanding and is agreeable with the plan.    Labs (all labs ordered are listed, but only abnormal results are displayed) Labs Reviewed - No data to display  EKG  EKG Interpretation None       Radiology No results found.  Procedures Procedures (including critical care time)  Medications Ordered in ED Medications  ketorolac (TORADOL) 30 MG/ML injection 30 mg (30 mg Intramuscular Not Given 10/29/16 1543)  acetaminophen (TYLENOL) tablet 650 mg (650 mg Oral Given 10/29/16 1542)     Initial Impression / Assessment and Plan / ED Course  I have reviewed the triage vital signs and the nursing notes.  Pertinent labs & imaging results that were available during my care of the patient were reviewed by me and considered in my medical decision making (see chart for details).    Dental pain associated with cracked tooth with no signs or symptoms of dental abscess on exam with patient afebrile, non toxic appearing, swallowing secretions well without hot potato voice. Exam unconcerning for Ludwig's angina or other deep tissue infection in neck.  As there is no gum swelling with fluctuance, erythema, and facial swelling, will not treat with antibiotic.  Pt advised to take ibuprofen, tylenol and benzocaine gel until she is able to see dentist. Urged patient to follow-up with dentist.  Patient given multiple resources to establish care with a dentist in the area.  She has dentist contact info, encouraged to  follow up in 1-2 days for ultimate management of dental pain and overall dental health. Strict ED return precautions given. Pt is aware of red flag symptoms that would warrant return to ED for re-evaluation and further treatment. Patient voices understanding and is agreeable to plan.    Final Clinical Impressions(s) / ED Diagnoses   Final diagnoses:  Pain, dental  Closed fracture of tooth, initial encounter    New Prescriptions Discharge Medication List as of 10/29/2016  4:08 PM    START taking these medications   Details  benzocaine (HURRICAINE) 20 % GEL Use as directed 1 application in the mouth or throat 3 (three) times daily as needed., Starting Sun 10/29/2016, Print       I personally performed the services described in this documentation, which was scribed in my presence. The recorded information has been reviewed and is accurate.     Debarah Crape  Renato Gails, PA-C 10/29/16 1704    Eber Hong, MD 11/01/16 [phone]

## 2016-10-29 NOTE — Discharge Instructions (Signed)
You have 2 cracked teeth. This is most likely the cause of your pain. There are no signs of infection today so we do not need to start you on antibiotics. There is always a risk for an infection with cracked teeth, please monitor for signs of infection which include swelling, redness, yellow discharge, bleeding, facial swelling, difficulty opening or closing your jaw. Return to the emergency department if you notice these symptoms. I recommend that you take high dose anti-inflammatory medications which will include 600 mg of ibuprofen +1000 mg of Tylenol every 6-8 hours. Additionally you have been prescribed a dental numbing gel which you can apply prior to eating, drinking and sleeping to alleviate some of the pain. Remember, your pain will continue until you take care and repair your broken teeth. Please see the attached dental resources, encouraged her to call a dentist tomorrow to be evaluated within 1-2 days.

## 2016-10-29 NOTE — ED Triage Notes (Signed)
Patient c/o right upper dental pain over 1 week. Denies any fevers. Per patient taking ibuprofen and goddy powder with no relief. Per patient last took goody powder this morning.

## 2019-05-16 ENCOUNTER — Other Ambulatory Visit: Payer: Self-pay

## 2019-05-16 DIAGNOSIS — Z20822 Contact with and (suspected) exposure to covid-19: Secondary | ICD-10-CM

## 2019-05-17 LAB — NOVEL CORONAVIRUS, NAA: SARS-CoV-2, NAA: NOT DETECTED

## 2019-05-22 ENCOUNTER — Other Ambulatory Visit (HOSPITAL_COMMUNITY): Payer: Self-pay | Admitting: Physician Assistant

## 2019-05-22 DIAGNOSIS — Z1231 Encounter for screening mammogram for malignant neoplasm of breast: Secondary | ICD-10-CM

## 2019-06-06 ENCOUNTER — Ambulatory Visit (HOSPITAL_COMMUNITY): Payer: Self-pay

## 2019-06-06 ENCOUNTER — Encounter (HOSPITAL_COMMUNITY): Payer: Self-pay

## 2019-06-06 ENCOUNTER — Other Ambulatory Visit: Payer: Self-pay

## 2019-06-06 ENCOUNTER — Emergency Department (HOSPITAL_COMMUNITY)
Admission: EM | Admit: 2019-06-06 | Discharge: 2019-06-06 | Disposition: A | Discharge status: Home / Self Care | Payer: Medicaid Other | Attending: Emergency Medicine | Admitting: Emergency Medicine

## 2019-06-06 DIAGNOSIS — S3992XA Unspecified injury of lower back, initial encounter: Secondary | ICD-10-CM | POA: Diagnosis present

## 2019-06-06 DIAGNOSIS — Y999 Unspecified external cause status: Secondary | ICD-10-CM | POA: Insufficient documentation

## 2019-06-06 DIAGNOSIS — Y93I9 Activity, other involving external motion: Secondary | ICD-10-CM | POA: Insufficient documentation

## 2019-06-06 DIAGNOSIS — Y92410 Unspecified street and highway as the place of occurrence of the external cause: Secondary | ICD-10-CM | POA: Diagnosis not present

## 2019-06-06 DIAGNOSIS — F17210 Nicotine dependence, cigarettes, uncomplicated: Secondary | ICD-10-CM | POA: Diagnosis not present

## 2019-06-06 DIAGNOSIS — J45909 Unspecified asthma, uncomplicated: Secondary | ICD-10-CM | POA: Diagnosis not present

## 2019-06-06 DIAGNOSIS — S39012A Strain of muscle, fascia and tendon of lower back, initial encounter: Secondary | ICD-10-CM | POA: Insufficient documentation

## 2019-06-06 MED ORDER — CYCLOBENZAPRINE HCL 10 MG PO TABS: 10.0000 mg | ORAL_TABLET | Freq: Three times a day (TID) | ORAL | 0 refills | Status: DC | PRN

## 2019-06-06 MED ORDER — OXYCODONE-ACETAMINOPHEN 5-325 MG PO TABS
1.0000 | ORAL_TABLET | Freq: Once | ORAL | Status: AC
  Administered 2019-06-06: 1 via ORAL
  Filled 2019-06-06: qty 1
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
## 2019-06-06 NOTE — ED Notes (Signed)
ED Provider at bedside. 
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
## 2020-01-08 ENCOUNTER — Emergency Department (HOSPITAL_COMMUNITY)
Admission: EM | Admit: 2020-01-08 | Discharge: 2020-01-08 | Disposition: A | Discharge status: Home / Self Care | Payer: Medicaid Other | Attending: Emergency Medicine | Admitting: Emergency Medicine

## 2020-01-08 ENCOUNTER — Encounter (HOSPITAL_COMMUNITY): Payer: Self-pay | Admitting: *Deleted

## 2020-01-08 ENCOUNTER — Other Ambulatory Visit: Payer: Self-pay

## 2020-01-08 ENCOUNTER — Emergency Department (HOSPITAL_COMMUNITY): Payer: Medicaid Other

## 2020-01-08 DIAGNOSIS — Z7982 Long term (current) use of aspirin: Secondary | ICD-10-CM | POA: Insufficient documentation

## 2020-01-08 DIAGNOSIS — R109 Unspecified abdominal pain: Secondary | ICD-10-CM | POA: Diagnosis present

## 2020-01-08 DIAGNOSIS — M545 Low back pain: Secondary | ICD-10-CM | POA: Insufficient documentation

## 2020-01-08 DIAGNOSIS — J45909 Unspecified asthma, uncomplicated: Secondary | ICD-10-CM | POA: Insufficient documentation

## 2020-01-08 DIAGNOSIS — F17210 Nicotine dependence, cigarettes, uncomplicated: Secondary | ICD-10-CM | POA: Diagnosis not present

## 2020-01-08 LAB — URINALYSIS, ROUTINE W REFLEX MICROSCOPIC
Bacteria, UA: NONE SEEN
Bilirubin Urine: NEGATIVE
Glucose, UA: NEGATIVE mg/dL
Ketones, ur: NEGATIVE mg/dL
Leukocytes,Ua: NEGATIVE
Nitrite: NEGATIVE
Protein, ur: NEGATIVE mg/dL
Specific Gravity, Urine: 1.032 — ABNORMAL HIGH (ref 1.005–1.030)
pH: 5 (ref 5.0–8.0)

## 2020-01-08 LAB — PREGNANCY, URINE: Preg Test, Ur: NEGATIVE

## 2020-01-08 MED ORDER — CYCLOBENZAPRINE HCL 10 MG PO TABS: 10.0000 mg | ORAL_TABLET | Freq: Three times a day (TID) | ORAL | 0 refills | Status: DC | PRN

## 2020-01-08 MED ORDER — PREDNISONE 10 MG PO TABS: 20.0000 mg | ORAL_TABLET | Freq: Two times a day (BID) | ORAL | 0 refills | Status: DC

## 2020-01-08 NOTE — ED Provider Notes (Signed)
[facility] EMERGENCY DEPARTMENT Provider Note   CSN: [id number] Arrival date & time: 01/08/20  2014     History Chief Complaint  Patient presents with   Flank Pain    [name] is a 44 y.o. female.  Patient is a 44 year old female with history of asthma and bronchitis.  She presents today for evaluation of back pain.  She describes pain to both flanks that radiates down her back that has been worsening over the past 2 days. She denies any specific injury or trauma, except she does perform repetitive movements at work in a [employer]..  She denies any radiation of the pain into her legs.  She denies any bowel or bladder complaints.  She has been trying ibuprofen, Goody powders, and Tylenol with no relief.  She tells me at times the pain is so bad it "makes her cry".  The history is provided by the patient.  Flank Pain This is a new problem. The current episode started 2 days ago. The problem occurs constantly. The problem has been rapidly worsening. Pertinent negatives include no chest pain and no abdominal pain. Exacerbated by: Movement, reaching upward.       Past Medical History:  Diagnosis Date   Asthma    Bronchitis     There are no problems to display for this patient.   Past Surgical History:  Procedure Laterality Date   CHOLECYSTECTOMY     TUBAL LIGATION       OB History    Gravida  2   Para  2   Term  2   Preterm      AB      Living  3     SAB      TAB      Ectopic      Multiple      Live Births              History reviewed. No pertinent family history.  Social History   Tobacco Use   Smoking status: Current Every Day Smoker    Packs/day: 0.50    Types: Cigarettes   Smokeless tobacco: Never Used  Substance Use Topics   Alcohol use: No   Drug use: No    Home Medications Prior to Admission medications   Medication Sig Start Date End Date Taking? Authorizing Provider  Aspirin-Salicylamide-Caffeine  (BC HEADACHE) 325-95-16 MG TABS Take 1 packet by mouth daily as needed (for pain).   Yes [provider]    Allergies    Codeine, Nsaids, Pineapple, Sulfa antibiotics, Darvocet [propoxyphene n-acetaminophen], Toradol [ketorolac tromethamine], and Tramadol  Review of Systems   Review of Systems  Cardiovascular: Negative for chest pain.  Gastrointestinal: Negative for abdominal pain.  Genitourinary: Positive for flank pain.  All other systems reviewed and are negative.   Physical Exam Updated Vital Signs BP (!) 152/74 (BP Location: Right Arm)    Pulse (!) 123    Temp 98.5 F (36.9 C) (Oral)    Resp 18    Ht 5\' 7"  (1.702 m)    Wt 113.4 kg    LMP 12/23/2019    SpO2 96%    BMI 39.16 kg/m   Physical Exam Vitals and nursing note reviewed.  Constitutional:      General: She is not in acute distress.    Appearance: She is well-developed. She is not diaphoretic.  HENT:     Head: Normocephalic and atraumatic.  Cardiovascular:     Rate and  Rhythm: Normal rate and regular rhythm.     Heart sounds: No murmur heard.  No friction rub. No gallop.   Pulmonary:     Effort: Pulmonary effort is normal. No respiratory distress.     Breath sounds: Normal breath sounds. No wheezing.  Abdominal:     General: Bowel sounds are normal. There is no distension.     Palpations: Abdomen is soft.     Tenderness: There is no abdominal tenderness.  Musculoskeletal:        General: Normal range of motion.     Cervical back: Normal range of motion and neck supple.     Comments: There is tenderness to palpation in the soft tissues of both flanks and soft tissues of the lower lumbar region.  There is no crepitus or bony tenderness.  Skin:    General: Skin is warm and dry.  Neurological:     Mental Status: She is alert and oriented to person, place, and time.     Comments: Strength is 5 out of 5 in both lower extremities.  Sensation is intact throughout.  DTRs are 2+ and symmetrical.     ED  Results / Procedures / Treatments   Labs (all labs ordered are listed, but only abnormal results are displayed) Labs Reviewed  PREGNANCY, URINE  URINALYSIS, ROUTINE W REFLEX MICROSCOPIC    EKG None  Radiology No results found.  Procedures Procedures (including critical care time)  Medications Ordered in ED Medications - No data to display  ED Course  I have reviewed the triage vital signs and the nursing notes.  Pertinent labs & imaging results that were available during my care of the patient were reviewed by me and considered in my medical decision making (see chart for details).    MDM Rules/Calculators/A&P  Patient is a 44 year old female presenting with bilateral flank pain that began in the absence of any injury or trauma.  Her pain is worse when she raises her arms above her head and twists and turns.  Her CT scan shows no evidence for renal calculus or other intra-abdominal process..  She does have moderate hemoglobin in the urine, the significance of which I am uncertain.  I highly suspect her discomfort is musculoskeletal in nature.  She will be treated with prednisone and Flexeril.  She is to follow-up with primary doctor if not improving.  Final Clinical Impression(s) / ED Diagnoses Final diagnoses:  None    Rx / DC Orders ED Discharge Orders    None       Geoffery Lyons, MD 01/08/20 2306

## 2020-01-08 NOTE — ED Triage Notes (Signed)
C/o right flank pain for 2 days, denies burning on urination or blood in urine. Emesis today while at work x 2.  Also c/o decrease hearing in left ear for a week.

## 2020-01-08 NOTE — Discharge Instructions (Addendum)
Begin taking prednisone as prescribed.  Take Flexeril as prescribed as needed for pain.  Follow-up with your primary doctor if symptoms are not improving in the next week.

## 2020-03-06 ENCOUNTER — Emergency Department (HOSPITAL_COMMUNITY)
Admission: EM | Admit: 2020-03-06 | Discharge: 2020-03-06 | Disposition: A | Discharge status: Home / Self Care | Payer: Medicaid Other | Attending: Emergency Medicine | Admitting: Emergency Medicine

## 2020-03-06 ENCOUNTER — Encounter (HOSPITAL_COMMUNITY): Payer: Self-pay | Admitting: Emergency Medicine

## 2020-03-06 ENCOUNTER — Emergency Department (HOSPITAL_COMMUNITY): Payer: Medicaid Other

## 2020-03-06 ENCOUNTER — Other Ambulatory Visit: Payer: Self-pay

## 2020-03-06 DIAGNOSIS — H9202 Otalgia, left ear: Secondary | ICD-10-CM | POA: Diagnosis not present

## 2020-03-06 DIAGNOSIS — W208XXA Other cause of strike by thrown, projected or falling object, initial encounter: Secondary | ICD-10-CM | POA: Insufficient documentation

## 2020-03-06 DIAGNOSIS — F17210 Nicotine dependence, cigarettes, uncomplicated: Secondary | ICD-10-CM | POA: Diagnosis not present

## 2020-03-06 DIAGNOSIS — Y998 Other external cause status: Secondary | ICD-10-CM | POA: Diagnosis not present

## 2020-03-06 DIAGNOSIS — Z79899 Other long term (current) drug therapy: Secondary | ICD-10-CM | POA: Diagnosis not present

## 2020-03-06 DIAGNOSIS — Y92480 Sidewalk as the place of occurrence of the external cause: Secondary | ICD-10-CM | POA: Insufficient documentation

## 2020-03-06 DIAGNOSIS — Y939 Activity, unspecified: Secondary | ICD-10-CM | POA: Diagnosis not present

## 2020-03-06 DIAGNOSIS — J45909 Unspecified asthma, uncomplicated: Secondary | ICD-10-CM | POA: Insufficient documentation

## 2020-03-06 DIAGNOSIS — S92424A Nondisplaced fracture of distal phalanx of right great toe, initial encounter for closed fracture: Secondary | ICD-10-CM | POA: Diagnosis not present

## 2020-03-06 DIAGNOSIS — S99921A Unspecified injury of right foot, initial encounter: Secondary | ICD-10-CM | POA: Diagnosis present

## 2020-03-06 MED ORDER — CIPROFLOXACIN-DEXAMETHASONE 0.3-0.1 % OT SUSP: 4.0000 [drp] | Freq: Two times a day (BID) | OTIC | 0 refills | Status: DC

## 2020-03-06 MED ORDER — HYDROCODONE-ACETAMINOPHEN 5-325 MG PO TABS: 2.0000 | ORAL_TABLET | ORAL | 0 refills | Status: DC | PRN

## 2020-03-06 NOTE — Discharge Instructions (Signed)
The x-ray shows you broke the bone in your right big toe.  Keep this toe bandaged and taped to the second toe.  Wear the postop shoe for support.  Use the eardrops as directed for 7 days.  Call the ENT provider listed, Dr. Suszanne Conners to arrange a follow-up appointment.

## 2020-03-06 NOTE — ED Triage Notes (Signed)
Pt c/o of right great toe injury this morning and left ear pain x 1 month

## 2020-03-06 NOTE — ED Provider Notes (Signed)
[facility] EMERGENCY DEPARTMENT Provider Note   CSN: [id number] Arrival date & time: 03/06/20  1756     History Chief Complaint  Patient presents with  . Foot Pain  . Otalgia    [name] is a 44 y.o. female.  HPI      [name] is a 44 y.o. female who presents to the Emergency Department complaining of right great toe pain and left ear pain.  She states that she accidentally dropped a concrete block on her right toe earlier today.  She complains of throbbing pain and bruising of her toe.  Pain is worse with weightbearing.  She denies numbness or injury of the toenail.  She also complains of persistent pain of her left ear and decreased hearing for 1 month.  She states that she was treated previously for ear infection, but symptoms have not improved.  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  She denies headaches, dizziness, neck pain, fever or chills.  No known Covid exposures.   Past Medical History:  Diagnosis Date  . Asthma   . Bronchitis     There are no problems to display for this patient.   Past Surgical History:  Procedure Laterality Date  . CHOLECYSTECTOMY    . TUBAL LIGATION       OB History    Gravida  2   Para  2   Term  2   Preterm      AB      Living  3     SAB      TAB      Ectopic      Multiple      Live Births              No family history on file.  Social History   Tobacco Use  . Smoking status: Current Every Day Smoker    Packs/day: 0.50    Types: Cigarettes  . Smokeless tobacco: Never Used  Substance Use Topics  . Alcohol use: No  . Drug use: No    Home Medications Prior to Admission medications   Medication Sig Start Date End Date Taking? Authorizing Provider  PROAIR HFA 108 [phone] Base) MCG/ACT inhaler Inhale 2 puffs into the lungs every 4 (four) hours as needed for shortness of breath. 02/23/20  Yes [provider]  ciprofloxacin-dexamethasone (CIPRODEX) OTIC  suspension Place 4 drops into the left ear 2 (two) times daily. For 7 days 03/06/20   Kealey Kemmer, PA-C  cyclobenzaprine (FLEXERIL) 10 MG tablet Take 1 tablet (10 mg total) by mouth 3 (three) times daily as needed for muscle spasms. Patient not taking: Reported on 03/06/2020 01/08/20   Geoffery Lyons, MD  HYDROcodone-acetaminophen (NORCO/VICODIN) 5-325 MG tablet Take 2 tablets by mouth every 4 (four) hours as needed. 03/06/20   Chrisoula Zegarra, PA-C  predniSONE (DELTASONE) 10 MG tablet Take 2 tablets (20 mg total) by mouth 2 (two) times daily. Patient not taking: Reported on 03/06/2020 01/08/20   Geoffery Lyons, MD    Allergies    Codeine, Nsaids, Pineapple, Sulfa antibiotics, Darvocet [propoxyphene n-acetaminophen], Toradol [ketorolac tromethamine], and Tramadol  Review of Systems   Review of Systems  Constitutional: Negative for chills and fever.  HENT: Positive for ear pain and hearing loss. Negative for congestion, sinus pressure, sinus pain and sore throat.   Respiratory: Negative for cough.   Cardiovascular: Negative for chest pain.  Genitourinary: Negative for difficulty urinating and dysuria.  Musculoskeletal: Positive for arthralgias (Right great toe pain and bruising). Negative for back pain, joint swelling and neck pain.  Skin: Negative for wound.  Neurological: Negative for dizziness, syncope, weakness, numbness and headaches.    Physical Exam Updated Vital Signs BP 125/68 (BP Location: Right Arm)   Pulse 88   Temp 98.4 F (36.9 C) (Oral)   Resp 16   Ht 5\' 7"  (1.702 m)   Wt 113.4 kg   SpO2 97%   BMI 39.16 kg/m   Physical Exam Vitals and nursing note reviewed.  Constitutional:      General: She is not in acute distress.    Appearance: Normal appearance. She is not toxic-appearing.  HENT:     Right Ear: Tympanic membrane and ear canal normal.     Left Ear: External ear normal. Decreased hearing noted. Tenderness present. No drainage. No foreign body. No mastoid  tenderness. Tympanic membrane is scarred. Tympanic membrane is not erythematous or bulging.     Ears:     Comments: Patient with scarring of the left TM and TM appears retracted.  No bulging or erythema.  Left ear canal appears slightly edematous, no drainage    Nose: Nose normal.     Mouth/Throat:     Mouth: Mucous membranes are moist.  Cardiovascular:     Rate and Rhythm: Normal rate and regular rhythm.     Pulses: Normal pulses.  Pulmonary:     Effort: Pulmonary effort is normal.     Breath sounds: Normal breath sounds.  Musculoskeletal:        General: Swelling, tenderness and signs of injury present.     Cervical back: Normal range of motion. No tenderness.     Comments: Diffuse tenderness to palpation of the right great toe.  Mild edema noted.  Ecchymosis noted at the distal and plantar surface of the toe.  Toenail is intact, no subungual hematoma.  No bony deformity.  No proximal tenderness.  Skin:    General: Skin is warm.     Capillary Refill: Capillary refill takes less than 2 seconds.  Neurological:     General: No focal deficit present.     Mental Status: She is alert.     Sensory: No sensory deficit.     Motor: No weakness.     ED Results / Procedures / Treatments   Labs (all labs ordered are listed, but only abnormal results are displayed) Labs Reviewed - No data to display  EKG None  Radiology DG Foot Complete Right  Result Date: 03/06/2020 CLINICAL DATA:  Right great toe injury. EXAM: RIGHT FOOT COMPLETE - 3+ VIEW COMPARISON:  Right ankle radiographs dated 12/25/2010. FINDINGS: There is a comminuted fracture of the first digit distal phalanx. This does not appear to extend to the interphalangeal joint. There is associated soft tissue swelling. No evidence of joint dislocation. A plantar calcaneal enthesophyte is noted. No radiopaque foreign body is identified. IMPRESSION: Comminuted fracture of the first digit distal phalanx. Electronically Signed   By: 02/24/2011 M.D.   On: 03/06/2020 18:36    Procedures Procedures (including critical care time)  Medications Ordered in ED Medications - No data to display  ED Course  I have reviewed the triage vital signs and the nursing notes.  Pertinent labs & imaging results that were available during my care of the patient were reviewed by me and considered in my medical decision making (see chart for details).    MDM Rules/Calculators/A&P  Patient with direct blow to the right great toe.  X-ray shows a comminuted fracture of the distal phalanx of the toe.  Toenail is intact and without subungual hematoma.  No open wound of the toe.  Neurovascularly intact.  Patient has mild edema of the left ear canal, TM is without erythema.  Appears scarred and retracted.  Left great toe buddy taped and postop shoe applied.  Patient agrees to care plan with elevation, ice, and will follow up with orthopedics in 1 week if needed.  We will also start on Ciprodex drops and will have her follow-up with local ENT.   Final Clinical Impression(s) / ED Diagnoses Final diagnoses:  Closed nondisplaced fracture of distal phalanx of right great toe, initial encounter  Otalgia, left ear    Rx / DC Orders ED Discharge Orders         Ordered    HYDROcodone-acetaminophen (NORCO/VICODIN) 5-325 MG tablet  Every 4 hours PRN     Discontinue  Reprint     03/06/20 2036    ciprofloxacin-dexamethasone (CIPRODEX) OTIC suspension  2 times daily     Discontinue  Reprint     03/06/20 2039           Pauline Aus, PA-C 03/06/20 2242    Derwood Kaplan, MD 03/06/20 2316

## 2020-03-08 MED FILL — Hydrocodone-Acetaminophen Tab 5-325 MG: ORAL | Qty: 6 | Status: AC

## 2020-03-12 ENCOUNTER — Ambulatory Visit: Payer: Medicaid Other | Admitting: Orthopedic Surgery

## 2020-03-12 ENCOUNTER — Encounter: Payer: Self-pay | Admitting: Orthopedic Surgery

## 2020-10-31 ENCOUNTER — Encounter (HOSPITAL_COMMUNITY): Payer: Self-pay | Admitting: Emergency Medicine

## 2020-10-31 ENCOUNTER — Other Ambulatory Visit: Payer: Self-pay

## 2020-10-31 ENCOUNTER — Emergency Department (HOSPITAL_COMMUNITY)
Admission: EM | Admit: 2020-10-31 | Discharge: 2020-10-31 | Disposition: A | Discharge status: Home / Self Care | Payer: Medicaid Other | Attending: Emergency Medicine | Admitting: Emergency Medicine

## 2020-10-31 DIAGNOSIS — F17210 Nicotine dependence, cigarettes, uncomplicated: Secondary | ICD-10-CM | POA: Diagnosis not present

## 2020-10-31 DIAGNOSIS — W57XXXA Bitten or stung by nonvenomous insect and other nonvenomous arthropods, initial encounter: Secondary | ICD-10-CM | POA: Diagnosis not present

## 2020-10-31 DIAGNOSIS — J45909 Unspecified asthma, uncomplicated: Secondary | ICD-10-CM | POA: Insufficient documentation

## 2020-10-31 DIAGNOSIS — S3992XA Unspecified injury of lower back, initial encounter: Secondary | ICD-10-CM | POA: Diagnosis present

## 2020-10-31 DIAGNOSIS — S21209A Unspecified open wound of unspecified back wall of thorax without penetration into thoracic cavity, initial encounter: Secondary | ICD-10-CM | POA: Insufficient documentation

## 2020-10-31 DIAGNOSIS — S30860A Insect bite (nonvenomous) of lower back and pelvis, initial encounter: Secondary | ICD-10-CM | POA: Insufficient documentation

## 2020-10-31 DIAGNOSIS — S21201A Unspecified open wound of right back wall of thorax without penetration into thoracic cavity, initial encounter: Secondary | ICD-10-CM

## 2020-10-31 MED ORDER — DOXYCYCLINE HYCLATE 100 MG PO TABS
200.0000 mg | ORAL_TABLET | Freq: Once | ORAL | Status: AC
  Administered 2020-10-31: 200 mg via ORAL
  Filled 2020-10-31: qty 2

## 2020-10-31 NOTE — ED Provider Notes (Addendum)
MSE was initiated and I personally evaluated the patient and placed orders (if any) at  1:58 PM on October 31, 2020.  Patient presents today due to a possible tick on her right back.  She states that she was playing this morning with her children in the yard and had multiple ticks on her body.  She pulled 3 ticks off.  Then she noticed the spot on her back that she could not remove so she came to the emergency department.  No other complaints.  The patient appears stable so that the remainder of the MSE may be completed by another provider.   Placido Sou, PA-C 10/31/20 1359    Placido Sou, PA-C 10/31/20 1400    Bethann Berkshire, MD 11/02/20 [phone]

## 2020-10-31 NOTE — ED Triage Notes (Signed)
Pt states she has tick on her back x2 days.

## 2020-10-31 NOTE — ED Provider Notes (Signed)
[facility] EMERGENCY DEPARTMENT Provider Note   CSN: [id number] Arrival date & time: 10/31/20  1321     History Chief Complaint  Patient presents with  . Tick Removal    [name] is a 45 y.o. female.  HPI      [name] is a 45 y.o. female, with a history of asthma and obesity, presenting to the ED with concern for tick bite for the last few days.  Patient states she has had at least 3 or 4 tick bites to various areas of her torso over the last several days. She is concerned there is still a tick present on her right lower back. Denies fever, rash, chest pain, shortness of breath, abdominal pain, N/V/D, neurologic symptoms, or any other complaints.   Past Medical History:  Diagnosis Date  . Asthma   . Bronchitis     There are no problems to display for this patient.   Past Surgical History:  Procedure Laterality Date  . CHOLECYSTECTOMY    . TUBAL LIGATION       OB History    Gravida  2   Para  2   Term  2   Preterm      AB      Living  3     SAB      IAB      Ectopic      Multiple      Live Births              History reviewed. No pertinent family history.  Social History   Tobacco Use  . Smoking status: Current Every Day Smoker    Packs/day: 0.50    Types: Cigarettes  . Smokeless tobacco: Never Used  Vaping Use  . Vaping Use: Never used  Substance Use Topics  . Alcohol use: No  . Drug use: No    Home Medications Prior to Admission medications   Medication Sig Start Date End Date Taking? Authorizing Provider  ciprofloxacin-dexamethasone (CIPRODEX) OTIC suspension Place 4 drops into the left ear 2 (two) times daily. For 7 days 03/06/20   Triplett, Tammy, PA-C  cyclobenzaprine (FLEXERIL) 10 MG tablet Take 1 tablet (10 mg total) by mouth 3 (three) times daily as needed for muscle spasms. Patient not taking: Reported on 03/06/2020 01/08/20   Geoffery Lyons, MD  HYDROcodone-acetaminophen (NORCO/VICODIN) 5-325 MG tablet  Take 2 tablets by mouth every 4 (four) hours as needed. 03/06/20   Triplett, Tammy, PA-C  predniSONE (DELTASONE) 10 MG tablet Take 2 tablets (20 mg total) by mouth 2 (two) times daily. Patient not taking: Reported on 03/06/2020 01/08/20   Geoffery Lyons, MD  PROAIR HFA 108 [phone] Base) MCG/ACT inhaler Inhale 2 puffs into the lungs every 4 (four) hours as needed for shortness of breath. 02/23/20   [provider]    Allergies    Codeine, Nsaids, Pineapple, Sulfa antibiotics, Darvocet [propoxyphene n-acetaminophen], Toradol [ketorolac tromethamine], and Tramadol  Review of Systems   Review of Systems  Constitutional: Negative for chills and fever.  Respiratory: Negative for cough and shortness of breath.   Cardiovascular: Negative for chest pain.  Gastrointestinal: Negative for abdominal pain, diarrhea, nausea and vomiting.  Musculoskeletal: Negative for arthralgias, joint swelling, neck pain and neck stiffness.  Skin: Positive for wound. Negative for rash.  Neurological: Negative for dizziness, syncope, weakness, numbness and headaches.    Physical Exam Updated Vital Signs BP (!) 124/99 (BP Location: Right Arm)   Pulse 88  Temp 98.2 F (36.8 C) (Oral)   Resp 16   Ht 5\' 7"  (1.702 m)   Wt 113.4 kg   LMP 10/31/2020   SpO2 98%   BMI 39.16 kg/m   Physical Exam Vitals and nursing note reviewed.  Constitutional:      General: She is not in acute distress.    Appearance: She is well-developed. She is not diaphoretic.  HENT:     Head: Normocephalic and atraumatic.  Eyes:     Conjunctiva/sclera: Conjunctivae normal.  Cardiovascular:     Rate and Rhythm: Normal rate and regular rhythm.  Pulmonary:     Effort: Pulmonary effort is normal.  Musculoskeletal:     Cervical back: Neck supple.     Comments: No rash, erythema, joint swelling, joint pain or tenderness.  Skin:    General: Skin is warm and dry.     Coloration: Skin is not pale.     Comments: Patient shows me 2 specific  areas with small red dots that she states are due to tick bites.  No surrounding erythema, tenderness, swelling, or fluctuance. She also indicates an area to her right lower back that she states she thinks currently has a tick in place. I inspected this area and this appears to be a small scabbed area. Patient insists that I checked to make sure there is not a tick underneath the scab.  I removed the scab and it appears as though this may have been caused from an ingrown hair that was then scratched or created a wound.  There is no purulence or drainage from the wound.  No noted tick within the wound.  Neurological:     Mental Status: She is alert.  Psychiatric:        Behavior: Behavior normal.     ED Results / Procedures / Treatments   Labs (all labs ordered are listed, but only abnormal results are displayed) Labs Reviewed - No data to display  EKG None  Radiology No results found.  Procedures Procedures   Medications Ordered in ED Medications  doxycycline (VIBRA-TABS) tablet 200 mg (200 mg Oral Given 10/31/20 1818)    ED Course  I have reviewed the triage vital signs and the nursing notes.  Pertinent labs & imaging results that were available during my care of the patient were reviewed by me and considered in my medical decision making (see chart for details).    MDM Rules/Calculators/A&P                          Patient presents with concern for tick bites.  I did not note any ticks currently in place. As a precaution, we will give the patient a dose of doxycycline.  She was advised to follow-up with a primary care provider on this matter. Return precautions discussed.  Patient voices understanding of these instructions, accepts the plan, and is comfortable with discharge.     Final Clinical Impression(s) / ED Diagnoses Final diagnoses:  Wound of right side of back, initial encounter  Tick bite of lower back, initial encounter    Rx / DC Orders ED Discharge  Orders    None       12/31/20 11/03/20 1122    11/05/20, MD 11/03/20 [phone]

## 2020-10-31 NOTE — Discharge Instructions (Addendum)
  Apply over-the-counter antibacterial ointment, such as Neosporin, twice daily for 5 days to the wounds we discussed. Follow-up with your primary care provider for any further evaluation or management of your complaints.

## 2020-11-08 ENCOUNTER — Other Ambulatory Visit: Payer: Self-pay

## 2020-11-08 ENCOUNTER — Emergency Department (HOSPITAL_COMMUNITY)
Admission: EM | Admit: 2020-11-08 | Discharge: 2020-11-08 | Disposition: A | Discharge status: Home / Self Care | Payer: Medicaid Other | Attending: Emergency Medicine | Admitting: Emergency Medicine

## 2020-11-08 ENCOUNTER — Emergency Department (HOSPITAL_COMMUNITY): Payer: Medicaid Other

## 2020-11-08 ENCOUNTER — Encounter (HOSPITAL_COMMUNITY): Payer: Self-pay | Admitting: *Deleted

## 2020-11-08 DIAGNOSIS — J45909 Unspecified asthma, uncomplicated: Secondary | ICD-10-CM | POA: Diagnosis not present

## 2020-11-08 DIAGNOSIS — R1084 Generalized abdominal pain: Secondary | ICD-10-CM | POA: Insufficient documentation

## 2020-11-08 DIAGNOSIS — F17210 Nicotine dependence, cigarettes, uncomplicated: Secondary | ICD-10-CM | POA: Insufficient documentation

## 2020-11-08 DIAGNOSIS — E860 Dehydration: Secondary | ICD-10-CM | POA: Diagnosis not present

## 2020-11-08 DIAGNOSIS — R112 Nausea with vomiting, unspecified: Secondary | ICD-10-CM | POA: Diagnosis present

## 2020-11-08 LAB — COMPREHENSIVE METABOLIC PANEL
ALT: 14 U/L (ref 0–44)
AST: 13 U/L — ABNORMAL LOW (ref 15–41)
Albumin: 3.9 g/dL (ref 3.5–5.0)
Alkaline Phosphatase: 65 U/L (ref 38–126)
Anion gap: 8 (ref 5–15)
BUN: 15 mg/dL (ref 6–20)
CO2: 25 mmol/L (ref 22–32)
Calcium: 8.6 mg/dL — ABNORMAL LOW (ref 8.9–10.3)
Chloride: 106 mmol/L (ref 98–111)
Creatinine, Ser: 0.63 mg/dL (ref 0.44–1.00)
GFR, Estimated: >60 mL/min (ref >60)
Glucose, Bld: 105 mg/dL — ABNORMAL HIGH (ref 70–99)
Potassium: 3.5 mmol/L (ref 3.5–5.1)
Sodium: 139 mmol/L (ref 135–145)
Total Bilirubin: 0.6 mg/dL (ref 0.3–1.2)
Total Protein: 6.8 g/dL (ref 6.5–8.1)

## 2020-11-08 LAB — CBC WITH DIFFERENTIAL/PLATELET
Abs Immature Granulocytes: 0.02 10*3/uL (ref 0.00–0.07)
Basophils Absolute: 0 10*3/uL (ref 0.0–0.1)
Basophils Relative: 0 %
Eosinophils Absolute: 0 10*3/uL (ref 0.0–0.5)
Eosinophils Relative: 0 %
HCT: 41.7 % (ref 36.0–46.0)
Hemoglobin: 13.1 g/dL (ref 12.0–15.0)
Immature Granulocytes: 0 %
Lymphocytes Relative: 7 %
Lymphs Abs: 0.5 10*3/uL — ABNORMAL LOW (ref 0.7–4.0)
MCH: 26.5 pg (ref 26.0–34.0)
MCHC: 31.4 g/dL (ref 30.0–36.0)
MCV: 84.2 fL (ref 80.0–100.0)
Monocytes Absolute: 0.5 10*3/uL (ref 0.1–1.0)
Monocytes Relative: 7 %
Neutro Abs: 6.1 10*3/uL (ref 1.7–7.7)
Neutrophils Relative %: 86 %
Platelets: 239 10*3/uL (ref 150–400)
RBC: 4.95 MIL/uL (ref 3.87–5.11)
RDW: 15.8 % — ABNORMAL HIGH (ref 11.5–15.5)
WBC: 7.1 10*3/uL (ref 4.0–10.5)
nRBC: 0 % (ref 0.0–0.2)

## 2020-11-08 LAB — URINALYSIS, ROUTINE W REFLEX MICROSCOPIC
Bilirubin Urine: NEGATIVE
Glucose, UA: NEGATIVE mg/dL
Hgb urine dipstick: NEGATIVE
Ketones, ur: 5 mg/dL — AB
Leukocytes,Ua: NEGATIVE
Nitrite: NEGATIVE
Protein, ur: NEGATIVE mg/dL
Specific Gravity, Urine: >1.046 — ABNORMAL HIGH (ref 1.005–1.030)
pH: 6 (ref 5.0–8.0)

## 2020-11-08 LAB — PREGNANCY, URINE: Preg Test, Ur: NEGATIVE

## 2020-11-08 LAB — LIPASE, BLOOD: Lipase: 24 U/L (ref 11–51)

## 2020-11-08 MED ORDER — SODIUM CHLORIDE 0.9 % IV BOLUS
1000.0000 mL | Freq: Once | INTRAVENOUS | Status: AC
  Administered 2020-11-08: 1000 mL via INTRAVENOUS

## 2020-11-08 MED ORDER — FENTANYL CITRATE (PF) 100 MCG/2ML IJ SOLN
50.0000 ug | Freq: Once | INTRAMUSCULAR | Status: AC
  Administered 2020-11-08: 50 ug via INTRAVENOUS
  Filled 2020-11-08: qty 2

## 2020-11-08 MED ORDER — ONDANSETRON HCL 4 MG/2ML IJ SOLN
4.0000 mg | Freq: Once | INTRAMUSCULAR | Status: AC
  Administered 2020-11-08: 4 mg via INTRAVENOUS
  Filled 2020-11-08: qty 2

## 2020-11-08 MED ORDER — IOHEXOL 300 MG/ML  SOLN
100.0000 mL | Freq: Once | INTRAMUSCULAR | Status: AC | PRN
  Administered 2020-11-08: 100 mL via INTRAVENOUS

## 2020-11-08 MED ORDER — ONDANSETRON HCL 4 MG PO TABS: 4.0000 mg | ORAL_TABLET | Freq: Three times a day (TID) | ORAL | 0 refills | Status: DC | PRN

## 2020-11-08 NOTE — ED Provider Notes (Signed)
[facility] EMERGENCY DEPARTMENT Provider Note   CSN: [id number] Arrival date & time: 11/08/20  1255     History Chief Complaint  Patient presents with  . Emesis    [name] is a 45 y.o. female.  She has no significant past medical history.  Complaining of acute onset of diffuse abdominal pain nausea and vomiting that started yesterday evening.  No sick contacts or recent travel.  No fever.  No blood in the vomitus.  No diarrhea.  The history is provided by the patient.  Emesis Severity:  Severe Duration:  20 hours Timing:  Intermittent Number of daily episodes:  20 Quality:  Bilious material Progression:  Unchanged Chronicity:  New Relieved by:  None tried Worsened by:  Nothing Ineffective treatments:  None tried Associated symptoms: abdominal pain   Associated symptoms: no cough, no diarrhea, no fever, no headaches and no sore throat   Abdominal pain:    Location:  Generalized   Quality: cramping     Severity:  Severe   Onset quality:  Gradual   Timing:  Intermittent   Progression:  Unchanged   Chronicity:  New Risk factors: no alcohol use, no sick contacts, no suspect food intake and no travel to endemic areas        Past Medical History:  Diagnosis Date  . Asthma   . Bronchitis     There are no problems to display for this patient.   Past Surgical History:  Procedure Laterality Date  . CHOLECYSTECTOMY    . TUBAL LIGATION       OB History    Gravida  2   Para  2   Term  2   Preterm      AB      Living  3     SAB      IAB      Ectopic      Multiple      Live Births              No family history on file.  Social History   Tobacco Use  . Smoking status: Current Every Day Smoker    Packs/day: 0.50    Types: Cigarettes  . Smokeless tobacco: Never Used  Vaping Use  . Vaping Use: Never used  Substance Use Topics  . Alcohol use: No  . Drug use: No    Home Medications Prior to Admission medications   Medication  Sig Start Date End Date Taking? Authorizing Provider  ciprofloxacin-dexamethasone (CIPRODEX) OTIC suspension Place 4 drops into the left ear 2 (two) times daily. For 7 days 03/06/20   Triplett, Tammy, PA-C  cyclobenzaprine (FLEXERIL) 10 MG tablet Take 1 tablet (10 mg total) by mouth 3 (three) times daily as needed for muscle spasms. Patient not taking: Reported on 03/06/2020 01/08/20   Geoffery Lyons, MD  HYDROcodone-acetaminophen (NORCO/VICODIN) 5-325 MG tablet Take 2 tablets by mouth every 4 (four) hours as needed. 03/06/20   Triplett, Tammy, PA-C  predniSONE (DELTASONE) 10 MG tablet Take 2 tablets (20 mg total) by mouth 2 (two) times daily. Patient not taking: Reported on 03/06/2020 01/08/20   Geoffery Lyons, MD  PROAIR HFA 108 [phone] Base) MCG/ACT inhaler Inhale 2 puffs into the lungs every 4 (four) hours as needed for shortness of breath. 02/23/20   [provider]    Allergies    Codeine, Nsaids, Pineapple, Sulfa antibiotics, Darvocet [propoxyphene n-acetaminophen], Toradol [ketorolac tromethamine], and Tramadol  Review of Systems   Review  of Systems  Constitutional: Negative for fever.  HENT: Negative for sore throat.   Eyes: Negative for visual disturbance.  Respiratory: Negative for cough and shortness of breath.   Cardiovascular: Negative for chest pain.  Gastrointestinal: Positive for abdominal pain, nausea and vomiting. Negative for diarrhea.  Genitourinary: Negative for dysuria.  Musculoskeletal: Negative for neck pain.  Skin: Negative for rash.  Neurological: Negative for headaches.    Physical Exam Updated Vital Signs BP 140/70 (BP Location: Right Arm)   Pulse 83   Temp 98.6 F (37 C) (Oral)   Resp 18   Ht 5\' 7"  (1.702 m)   Wt 111.6 kg   LMP 10/31/2020   SpO2 99%   BMI 38.54 kg/m   Physical Exam Vitals and nursing note reviewed.  Constitutional:      General: She is not in acute distress.    Appearance: Normal appearance. She is well-developed.  HENT:      Head: Normocephalic and atraumatic.  Eyes:     Conjunctiva/sclera: Conjunctivae normal.  Cardiovascular:     Rate and Rhythm: Normal rate and regular rhythm.     Heart sounds: No murmur heard.   Pulmonary:     Effort: Pulmonary effort is normal. No respiratory distress.     Breath sounds: Normal breath sounds.  Abdominal:     Palpations: Abdomen is soft.     Tenderness: There is no abdominal tenderness. There is no guarding or rebound.  Musculoskeletal:        General: No deformity or signs of injury. Normal range of motion.     Cervical back: Neck supple.  Skin:    General: Skin is warm and dry.  Neurological:     General: No focal deficit present.     Mental Status: She is alert.     ED Results / Procedures / Treatments   Labs (all labs ordered are listed, but only abnormal results are displayed) Labs Reviewed  CBC WITH DIFFERENTIAL/PLATELET - Abnormal; Notable for the following components:      Result Value   RDW 15.8 (*)    Lymphs Abs 0.5 (*)    All other components within normal limits  COMPREHENSIVE METABOLIC PANEL - Abnormal; Notable for the following components:   Glucose, Bld 105 (*)    Calcium 8.6 (*)    AST 13 (*)    All other components within normal limits  URINALYSIS, ROUTINE W REFLEX MICROSCOPIC - Abnormal; Notable for the following components:   Specific Gravity, Urine >1.046 (*)    Ketones, ur 5 (*)    All other components within normal limits  LIPASE, BLOOD  PREGNANCY, URINE    EKG None  Radiology CT Abdomen Pelvis W Contrast  Result Date: 11/08/2020 CLINICAL DATA:  Abdominal pain and nausea for 1 day. EXAM: CT ABDOMEN AND PELVIS WITH CONTRAST TECHNIQUE: Multidetector CT imaging of the abdomen and pelvis was performed using the standard protocol following bolus administration of intravenous contrast. CONTRAST:  11/10/2020 OMNIPAQUE IOHEXOL 300 MG/ML  SOLN COMPARISON:  CT scan 01/08/2020 FINDINGS: Lower chest: The lung bases are clear of acute process.  No pleural effusion or pulmonary lesions. The heart is normal in size. No pericardial effusion. The distal esophagus and aorta are unremarkable. Hepatobiliary: No hepatic lesions or intrahepatic biliary dilatation. The gallbladder is surgically absent. No common bile duct dilatation. Pancreas: No mass, inflammation or ductal dilatation. Spleen: The elongated spleen but overall normal in size/volume. No splenic lesions. Adrenals/Urinary Tract: The adrenal glands and kidneys are  unremarkable. Small renal cysts. No worrisome renal lesions. No renal or obstructing ureteral calculi. No bladder mass or bladder calculi. Stomach/Bowel: The stomach, duodenum, small bowel and colon are unremarkable. No acute inflammatory changes, mass lesions or obstructive findings. The terminal ileum is normal. The appendix is normal. Vascular/Lymphatic: The aorta is normal in caliber. No dissection. The branch vessels are patent. The major venous structures are patent. No mesenteric or retroperitoneal mass or adenopathy. Small scattered lymph nodes are noted. Reproductive: The uterus and ovaries are unremarkable. Other: No pelvic mass or adenopathy. No free pelvic fluid collections. No inguinal mass or adenopathy. No abdominal wall hernia or subcutaneous lesions. Musculoskeletal: No significant bony findings. IMPRESSION: 1. No acute abdominal/pelvic findings, mass lesions or adenopathy. 2. Status post cholecystectomy. No biliary dilatation. Electronically Signed   By: Rudie Meyer M.D.   On: 11/08/2020 16:35    Procedures Procedures   Medications Ordered in ED Medications  sodium chloride 0.9 % bolus 1,000 mL (0 mLs Intravenous Stopped 11/08/20 1805)  ondansetron (ZOFRAN) injection 4 mg (4 mg Intravenous Given 11/08/20 1550)  fentaNYL (SUBLIMAZE) injection 50 mcg (50 mcg Intravenous Given 11/08/20 1633)  iohexol (OMNIPAQUE) 300 MG/ML solution 100 mL (100 mLs Intravenous Contrast Given 11/08/20 1618)    ED Course  I have  reviewed the triage vital signs and the nursing notes.  Pertinent labs & imaging results that were available during my care of the patient were reviewed by me and considered in my medical decision making (see chart for details).  Clinical Course as of 11/09/20 1028  Mon Nov 08, 2020  1720 Patient's lab unremarkable other than looking slightly dehydrated.  Imaging unremarkable.  Patient p.o. trialed well.  Feels better.  Will discharge with some Zofran. [MB]    Clinical Course User Index [MB] Terrilee Files, MD   MDM Rules/Calculators/A&P                         This patient complains of abdominal pain nausea vomiting; this involves an extensive number of treatment Options and is a complaint that carries with it a high risk of complications and Morbidity. The differential includes gastritis, obstruction, cholelithiasis, cholecystitis, appendicitis, diverticulitis  I ordered, reviewed and interpreted labs, which included CBC with normal white count normal hemoglobin, chemistries normal, LFTs normal, urinalysis without signs of infection, normal lipase, pregnancy test negative I ordered medication IV fluids, IV pain medication and Zofran I ordered imaging studies which included CT abdomen and pelvis and I independently    visualized and interpreted imaging which showed no acute findings Previous records obtained and reviewed in epic, no recent admissions  After the interventions stated above, I reevaluated the patient and found patient to be symptomatically improved.  Reviewed work-up with her.  She is comfortable plan for outpatient management of her symptoms.  Return instructions discussed   Final Clinical Impression(s) / ED Diagnoses Final diagnoses:  Non-intractable vomiting with nausea, unspecified vomiting type  Generalized abdominal pain    Rx / DC Orders ED Discharge Orders         Ordered    ondansetron (ZOFRAN) 4 MG tablet  Every 8 hours PRN        11/08/20 1723            Terrilee Files, MD 11/09/20 1030

## 2020-11-08 NOTE — ED Notes (Signed)
Pt given gingerale and crackers 

## 2020-11-08 NOTE — Discharge Instructions (Addendum)
You are seen in the emergency department for nausea vomiting and abdominal pain.  You had lab work urinalysis and a CAT scan that did not show any explanation for your symptoms.  We are sending you with a prescription for nausea medication.  Clear liquid diet advance as tolerated.  Follow-up with your doctor.  Return to the emergency department for any worsening or concerning symptoms

## 2020-11-08 NOTE — ED Provider Notes (Signed)
MSE was initiated and I personally evaluated the patient and placed orders (if any) at  2:32 PM on November 08, 2020.  The patient appears stable so that the remainder of the MSE may be completed by another provider.  20 episodes of nonbloody vomiting since last night.  Patient states she had similar episodes in the past.  Complaining of feeling of dehydration and generalized abdominal pain.  Denies fevers or diarrhea.   Cheryll Cockayne, MD 11/08/20 1432

## 2020-11-08 NOTE — ED Notes (Signed)
Pt transported to CT ?

## 2020-11-08 NOTE — ED Notes (Signed)
Pt given ice chips per request, MD aware.

## 2020-11-08 NOTE — ED Notes (Signed)
Pt returned from CT °

## 2020-11-08 NOTE — ED Triage Notes (Signed)
Abdominal pain with vomiting onset last night

## 2020-11-15 ENCOUNTER — Other Ambulatory Visit: Payer: Self-pay

## 2020-11-15 ENCOUNTER — Emergency Department (HOSPITAL_COMMUNITY): Payer: Medicaid Other

## 2020-11-15 ENCOUNTER — Emergency Department (HOSPITAL_COMMUNITY)
Admission: EM | Admit: 2020-11-15 | Discharge: 2020-11-15 | Disposition: A | Discharge status: Home / Self Care | Payer: Medicaid Other | Attending: Emergency Medicine | Admitting: Emergency Medicine

## 2020-11-15 DIAGNOSIS — M79671 Pain in right foot: Secondary | ICD-10-CM | POA: Diagnosis present

## 2020-11-15 DIAGNOSIS — J45909 Unspecified asthma, uncomplicated: Secondary | ICD-10-CM | POA: Insufficient documentation

## 2020-11-15 DIAGNOSIS — F17210 Nicotine dependence, cigarettes, uncomplicated: Secondary | ICD-10-CM | POA: Insufficient documentation

## 2020-11-15 MED ORDER — PREDNISONE 50 MG PO TABS
60.0000 mg | ORAL_TABLET | Freq: Once | ORAL | Status: AC
  Administered 2020-11-15: 60 mg via ORAL
  Filled 2020-11-15: qty 1

## 2020-11-15 MED ORDER — HYDROCODONE-ACETAMINOPHEN 5-325 MG PO TABS: 1.0000 | ORAL_TABLET | Freq: Four times a day (QID) | ORAL | 0 refills | Status: DC | PRN

## 2020-11-15 MED ORDER — PREDNISONE 10 MG PO TABS: ORAL_TABLET | ORAL | 0 refills | Status: AC

## 2020-11-15 MED ORDER — HYDROCODONE-ACETAMINOPHEN 5-325 MG PO TABS
1.0000 | ORAL_TABLET | Freq: Once | ORAL | Status: AC
  Administered 2020-11-15: 1 via ORAL
  Filled 2020-11-15: qty 1

## 2020-11-15 NOTE — Discharge Instructions (Signed)
  You have been seen today for heel pain. There were no acute abnormalities on the x-rays, including no sign of fracture or dislocation, however, there could be injuries to the soft tissues, such as the ligaments or tendons that are not seen on xrays. There could also be what are called occult fractures that are small fractures not seen on xray. Antiinflammatory medications: Take 600 mg of ibuprofen every 6 hours or 440 mg (over the counter dose) to 500 mg (prescription dose) of naproxen every 12 hours for the next 3 days. After this time, these medications may be used as needed for pain. Take these medications with food to avoid upset stomach. Choose only one of these medications, do not take them together. Acetaminophen (generic for Tylenol): Should you continue to have additional pain while taking the ibuprofen or naproxen, you may add in acetaminophen as needed. Your daily total maximum amount of acetaminophen from all sources should be limited to 4000mg /day for persons without liver problems, or 2000mg /day for those with liver problems. Prednisone: Take the prednisone, as prescribed, until finished. If you are a diabetic, please know prednisone can raise your blood sugar temporarily. Ice: May apply ice to the area over the next 24 hours for 15 minutes at a time to reduce swelling. Elevation: Keep the extremity elevated as often as possible to reduce pain and inflammation. Support: Wear the ACE wrap for support and comfort. Wear this until pain resolves. You will be weight-bearing as tolerated, which means you can slowly start to put weight on the extremity and increase amount and frequency as pain allows. Follow up: If symptoms are improving, you may follow up with your primary care provider for any continued management. If symptoms are not starting to improve within a week, you should follow up with the orthopedic specialist within two weeks. Return: Return to the ED for numbness, weakness, increasing  pain, overall worsening symptoms, loss of function, or if symptoms are not improving, you have tried to follow up with the orthopedic specialist, and have been unable to do so.  For prescription assistance, may try using prescription discount sites or apps, such as goodrx.com or Good Rx smart phone app.

## 2020-11-15 NOTE — ED Notes (Signed)
Pt c/o right heel pain starting today.  Pt denies any injury to heel.  Pt states she is unable to put weight on the heel and tender to touch.

## 2020-11-15 NOTE — ED Provider Notes (Signed)
[facility] EMERGENCY DEPARTMENT Provider Note   CSN: [id number] Arrival date & time: 11/15/20  1827     History No chief complaint on file.   [name] is a 45 y.o. female.  HPI      [name] is a 45 y.o. female, with a history of asthma and bronchitis, presenting to the ED with posterior right heel pain starting today.  Worse with ambulation. Sharp and aching pain, constant, nonradiating. Denies fever, swelling, color abnormality, calf pain/swelling, numbness, weakness, trauma, or any other complaints.    Past Medical History:  Diagnosis Date  . Asthma   . Bronchitis     There are no problems to display for this patient.   Past Surgical History:  Procedure Laterality Date  . CHOLECYSTECTOMY    . TUBAL LIGATION       OB History    Gravida  2   Para  2   Term  2   Preterm      AB      Living  3     SAB      IAB      Ectopic      Multiple      Live Births              No family history on file.  Social History   Tobacco Use  . Smoking status: Current Every Day Smoker    Packs/day: 0.50    Types: Cigarettes  . Smokeless tobacco: Never Used  Vaping Use  . Vaping Use: Never used  Substance Use Topics  . Alcohol use: No  . Drug use: No    Home Medications Prior to Admission medications   Medication Sig Start Date End Date Taking? Authorizing Provider  HYDROcodone-acetaminophen (NORCO/VICODIN) 5-325 MG tablet Take 1 tablet by mouth every 6 (six) hours as needed for severe pain. 11/15/20  Yes Breon Diss C, PA-C  predniSONE (DELTASONE) 10 MG tablet Take 4 tablets (40 mg total) by mouth daily for 5 days, THEN 3 tablets (30 mg total) daily for 1 day, THEN 2 tablets (20 mg total) daily for 1 day, THEN 1 tablet (10 mg total) daily for 1 day. 11/15/20 11/23/20 Yes Mayre Bury C, PA-C  ciprofloxacin-dexamethasone (CIPRODEX) OTIC suspension Place 4 drops into the left ear 2 (two) times daily. For 7 days 03/06/20   Triplett, Tammy, PA-C   cyclobenzaprine (FLEXERIL) 10 MG tablet Take 1 tablet (10 mg total) by mouth 3 (three) times daily as needed for muscle spasms. Patient not taking: Reported on 03/06/2020 01/08/20   Geoffery Lyons, MD  ondansetron (ZOFRAN) 4 MG tablet Take 1 tablet (4 mg total) by mouth every 8 (eight) hours as needed for nausea or vomiting. 11/08/20   Terrilee Files, MD  [facility] HFA 108 [phone] Base) MCG/ACT inhaler Inhale 2 puffs into the lungs every 4 (four) hours as needed for shortness of breath. 02/23/20   [provider]    Allergies    Codeine, Nsaids, Pineapple, Sulfa antibiotics, Darvocet [propoxyphene n-acetaminophen], Toradol [ketorolac tromethamine], and Tramadol  Review of Systems   Review of Systems  Constitutional: Negative for fever.  Musculoskeletal:       Right heel pain  Skin: Negative for color change.  Neurological: Negative for weakness and numbness.    Physical Exam Updated Vital Signs BP (!) 150/75 (BP Location: Right Arm)   Pulse 79   Temp 98.4 F (36.9 C) (Oral)   Resp 18   LMP  10/31/2020   SpO2 99%   Physical Exam Vitals and nursing note reviewed.  Constitutional:      General: She is not in acute distress.    Appearance: She is well-developed. She is not diaphoretic.  HENT:     Head: Normocephalic and atraumatic.  Eyes:     Conjunctiva/sclera: Conjunctivae normal.  Cardiovascular:     Rate and Rhythm: Normal rate and regular rhythm.     Pulses:          Dorsalis pedis pulses are 2+ on the right side.       Posterior tibial pulses are 2+ on the right side.  Pulmonary:     Effort: Pulmonary effort is normal.  Musculoskeletal:     Cervical back: Neck supple.     Comments: Tenderness of the right posterior heel without swelling, erythema, deformity, or instability.  No tenderness throughout the rest of the foot or ankle. The Achilles appears to be intact with appropriate tension and movement of the foot and ankle. No tenderness, pain, swelling, erythema,  increased warmth to the calf or into the foot.  Skin:    General: Skin is warm and dry.     Capillary Refill: Capillary refill takes less than 2 seconds.     Coloration: Skin is not pale.  Neurological:     Mental Status: She is alert.     Comments: Sensation light touch grossly intact in the right foot and ankle. Strength 5/5 with flexion and extension at the right ankle.  Psychiatric:        Behavior: Behavior normal.     ED Results / Procedures / Treatments   Labs (all labs ordered are listed, but only abnormal results are displayed) Labs Reviewed - No data to display  EKG None  Radiology No results found.  DG Foot Complete Right  Result Date: 11/15/2020 CLINICAL DATA:  45 year old female with right heel pain. No known injury. EXAM: RIGHT FOOT COMPLETE - 3+ VIEW COMPARISON:  None. FINDINGS: There is no acute fracture or dislocation. Degenerative changes of the medial ankle with small chronic fragmentation. The soft tissues are unremarkable. IMPRESSION: Negative. Electronically Signed   By: Elgie Collard M.D.   On: 11/15/2020 21:00   Procedures Procedures   Medications Ordered in ED Medications  predniSONE (DELTASONE) tablet 60 mg (60 mg Oral Given 11/15/20 2220)  HYDROcodone-acetaminophen (NORCO/VICODIN) 5-325 MG per tablet 1 tablet (1 tablet Oral Given 11/15/20 2220)    ED Course  I have reviewed the triage vital signs and the nursing notes.  Pertinent labs & imaging results that were available during my care of the patient were reviewed by me and considered in my medical decision making (see chart for details).    MDM Rules/Calculators/A&P                          Patient presents with pain in the right posterior heel, atraumatic in origin. Suspicion is low for septic joint in the right ankle.  She appears to have an intact Achilles.  No acute abnormalities on x-ray. Orthopedic follow-up for any further.   Final Clinical Impression(s) / ED Diagnoses Final  diagnoses:  Pain of right heel    Rx / DC Orders ED Discharge Orders         Ordered    predniSONE (DELTASONE) 10 MG tablet        11/15/20 2208    HYDROcodone-acetaminophen (NORCO/VICODIN) 5-325 MG tablet  Every 6  hours PRN        11/15/20 2208           Anselm Pancoast, PA-C 11/18/20 1630    Bethann Berkshire, MD 11/20/20 [phone]

## 2020-11-15 NOTE — ED Triage Notes (Signed)
Right heel pain started today. Hurts with walking.

## 2021-09-04 IMAGING — DX DG FOOT COMPLETE 3+V*R*
3 series · 3 of 3 positions shown · non-contrast
Comparison: Right ankle radiographs dated 12/25/2010.

CLINICAL DATA: Right great toe injury.

EXAM:
RIGHT FOOT COMPLETE - 3+ VIEW

[foot ap]
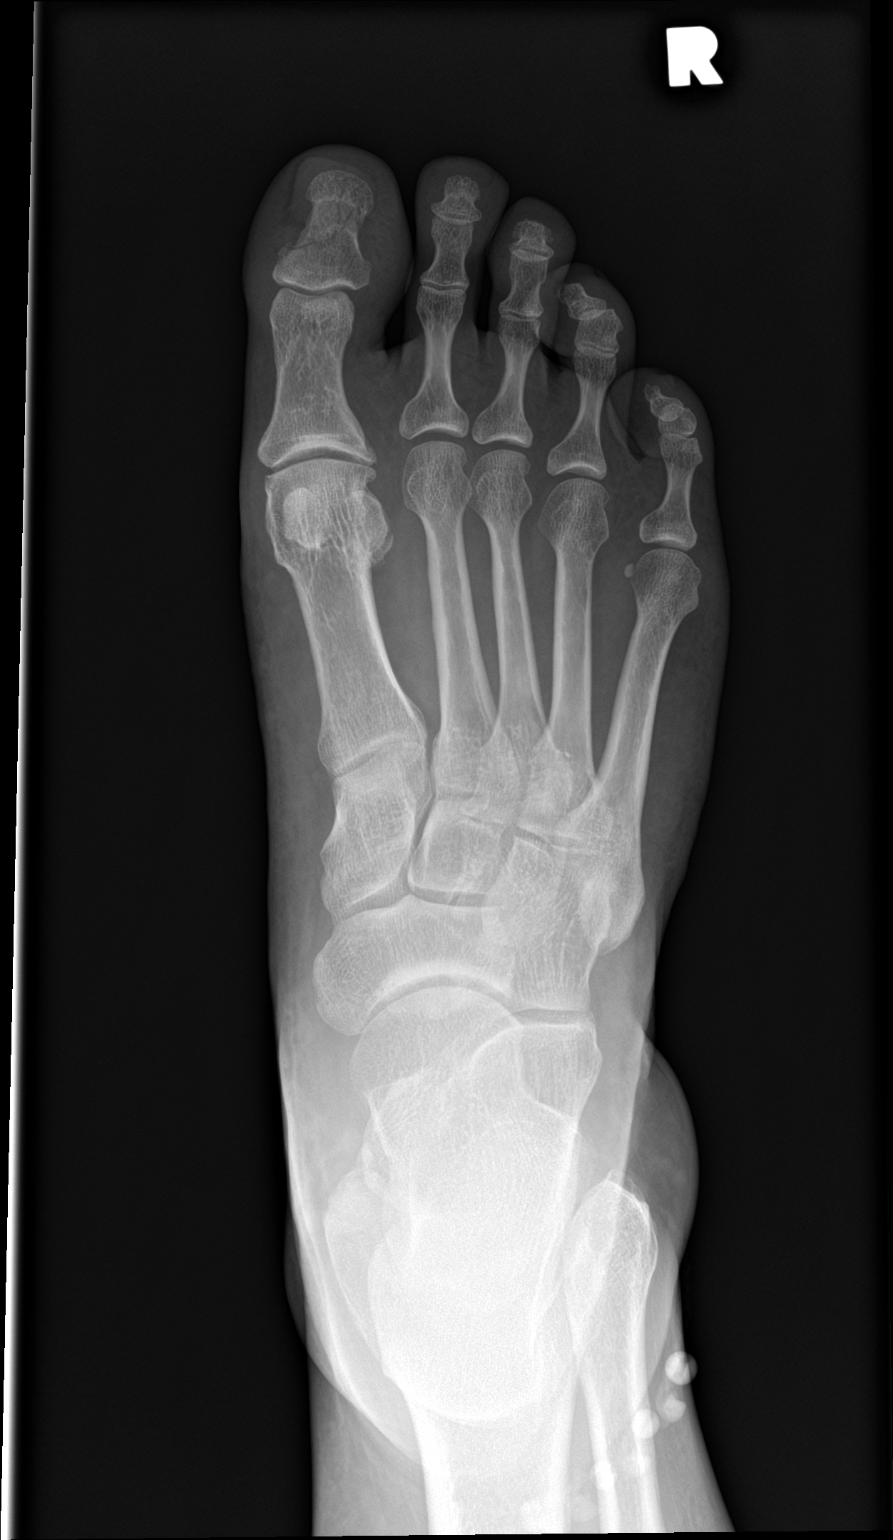

[foot obl]
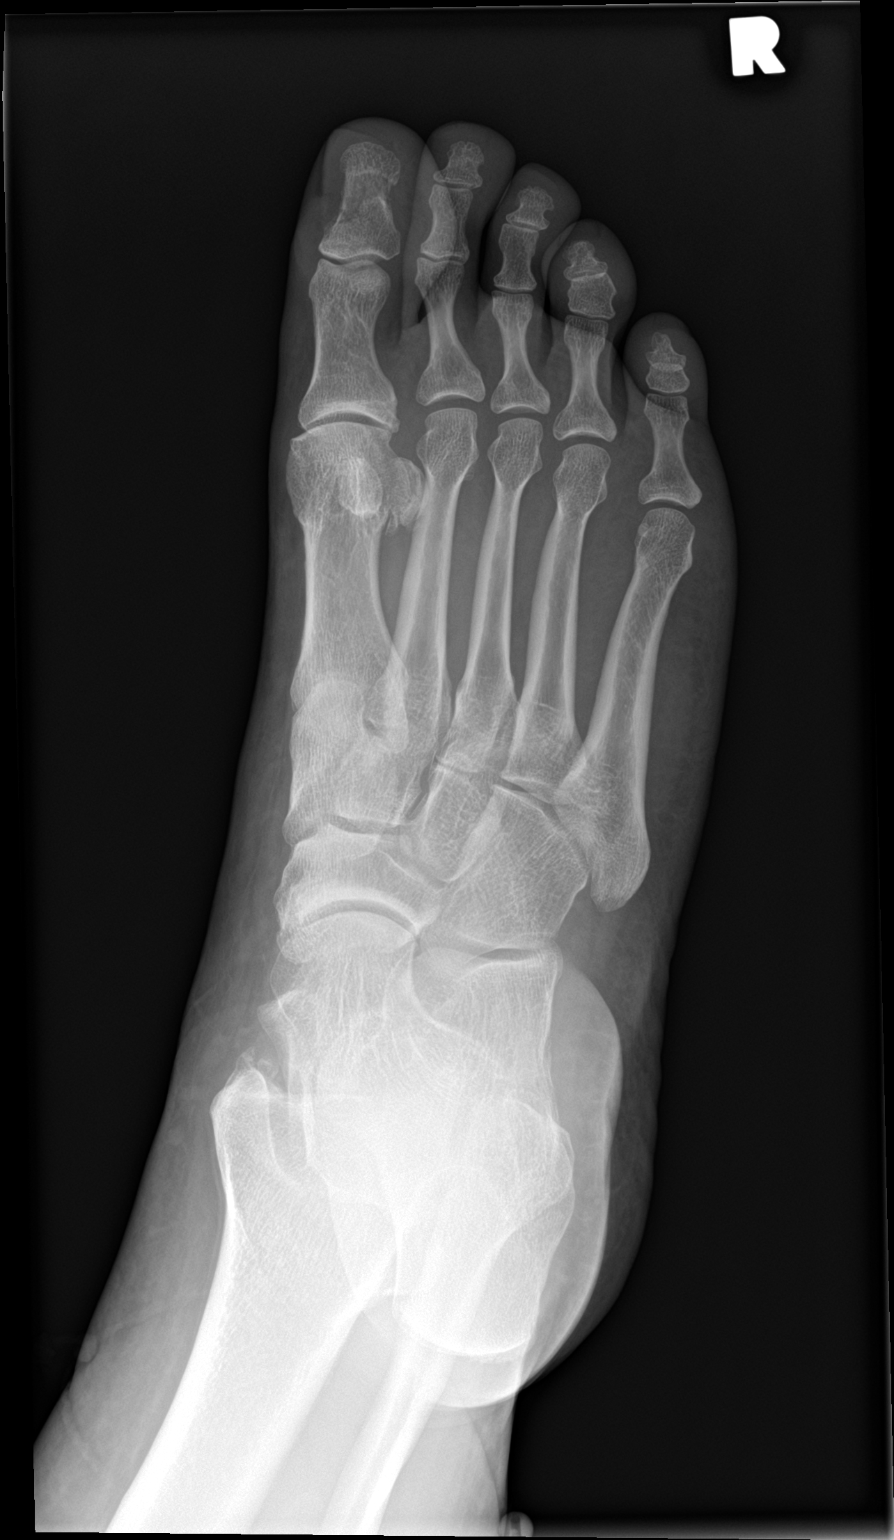

[foot lat]
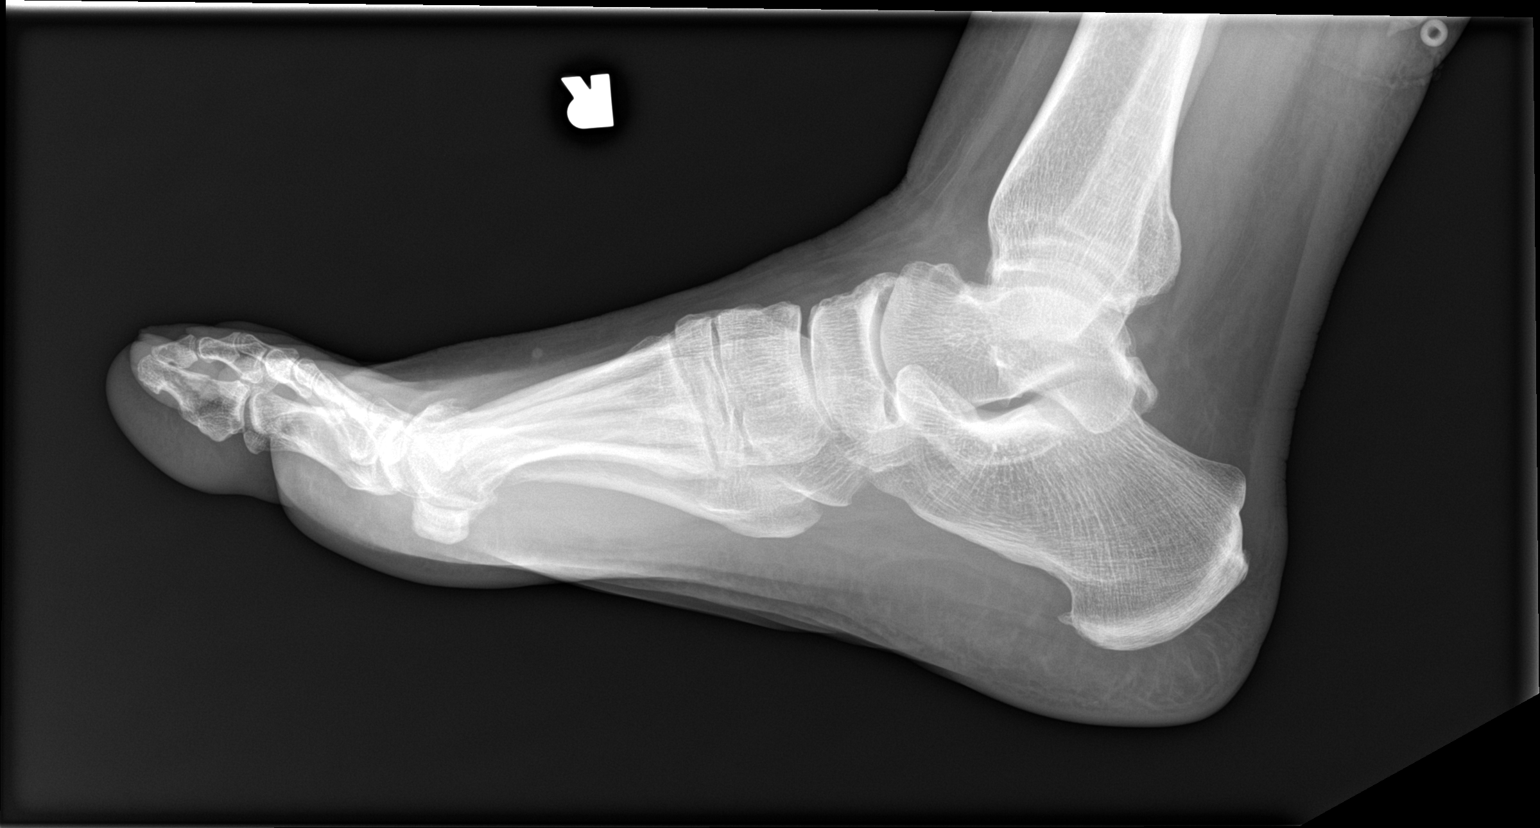

[3 of 3 positions shown; findings below may reference images not displayed]

FINDINGS: There is a comminuted fracture of the first digit distal phalanx.
This does not appear to extend to the interphalangeal joint. There
is associated soft tissue swelling. No evidence of joint
dislocation. A plantar calcaneal enthesophyte is noted. No
radiopaque foreign body is identified.
IMPRESSION: Comminuted fracture of the first digit distal phalanx.

## 2021-11-13 ENCOUNTER — Encounter (HOSPITAL_COMMUNITY): Payer: Self-pay

## 2021-11-13 ENCOUNTER — Other Ambulatory Visit: Payer: Self-pay

## 2021-11-13 ENCOUNTER — Emergency Department (HOSPITAL_COMMUNITY)
Admission: EM | Admit: 2021-11-13 | Discharge: 2021-11-13 | Disposition: A | Discharge status: Home / Self Care | Payer: Medicaid Other | Attending: Emergency Medicine | Admitting: Emergency Medicine

## 2021-11-13 DIAGNOSIS — F17200 Nicotine dependence, unspecified, uncomplicated: Secondary | ICD-10-CM | POA: Diagnosis not present

## 2021-11-13 DIAGNOSIS — J45909 Unspecified asthma, uncomplicated: Secondary | ICD-10-CM | POA: Insufficient documentation

## 2021-11-13 DIAGNOSIS — N949 Unspecified condition associated with female genital organs and menstrual cycle: Secondary | ICD-10-CM

## 2021-11-13 DIAGNOSIS — F419 Anxiety disorder, unspecified: Secondary | ICD-10-CM | POA: Insufficient documentation

## 2021-11-13 DIAGNOSIS — R102 Pelvic and perineal pain: Secondary | ICD-10-CM | POA: Diagnosis not present

## 2021-11-13 DIAGNOSIS — M542 Cervicalgia: Secondary | ICD-10-CM | POA: Insufficient documentation

## 2021-11-13 DIAGNOSIS — Z202 Contact with and (suspected) exposure to infections with a predominantly sexual mode of transmission: Secondary | ICD-10-CM | POA: Diagnosis not present

## 2021-11-13 DIAGNOSIS — N898 Other specified noninflammatory disorders of vagina: Secondary | ICD-10-CM | POA: Diagnosis not present

## 2021-11-13 DIAGNOSIS — Z711 Person with feared health complaint in whom no diagnosis is made: Secondary | ICD-10-CM

## 2021-11-13 LAB — URINALYSIS, ROUTINE W REFLEX MICROSCOPIC
Bacteria, UA: NONE SEEN
Bilirubin Urine: NEGATIVE
Glucose, UA: NEGATIVE mg/dL
Ketones, ur: NEGATIVE mg/dL
Leukocytes,Ua: NEGATIVE
Nitrite: NEGATIVE
Protein, ur: NEGATIVE mg/dL
Specific Gravity, Urine: 1.016 (ref 1.005–1.030)
pH: 6 (ref 5.0–8.0)

## 2021-11-13 LAB — POC URINE PREG, ED: Preg Test, Ur: NEGATIVE

## 2021-11-13 LAB — WET PREP, GENITAL
Clue Cells Wet Prep HPF POC: NONE SEEN
Sperm: NONE SEEN
Trich, Wet Prep: NONE SEEN
WBC, Wet Prep HPF POC: <10 (ref <10)
Yeast Wet Prep HPF POC: NONE SEEN

## 2021-11-13 MED ORDER — CEFTRIAXONE SODIUM 500 MG IJ SOLR
500.0000 mg | Freq: Once | INTRAMUSCULAR | Status: AC
  Administered 2021-11-13: 500 mg via INTRAMUSCULAR

## 2021-11-13 MED ORDER — CEFTRIAXONE SODIUM 1 G IJ SOLR
1.0000 g | Freq: Once | INTRAMUSCULAR | Status: DC
  Filled 2021-11-13: qty 10

## 2021-11-13 MED ORDER — LIDOCAINE HCL (PF) 1 % IJ SOLN
INTRAMUSCULAR | Status: AC
  Filled 2021-11-13: qty 2

## 2021-11-13 MED ORDER — METRONIDAZOLE 500 MG PO TABS
2000.0000 mg | ORAL_TABLET | Freq: Once | ORAL | Status: AC
  Administered 2021-11-13: 2000 mg via ORAL
  Filled 2021-11-13: qty 4

## 2021-11-13 MED ORDER — DOXYCYCLINE HYCLATE 100 MG PO CAPS: 100.0000 mg | ORAL_CAPSULE | Freq: Two times a day (BID) | ORAL | 0 refills | Status: AC

## 2021-11-13 NOTE — Discharge Instructions (Addendum)
You have been provided with antibiotics in the ED today.  The rest of been called into your pharmacy.  Please pick these up tomorrow and take them as directed.  Please take them to completion or until you are told to stop taking them. ? ?You have requested to return tomorrow for an ultrasound appointment.  Further instructions on how to schedule this appointment have been provided for you in your discharge paperwork. ? ?As discussed some of the test results may not show up for the next 1 to 2 days.  You may check in with your MyChart for status updates.  If you have anything that is questionable where that needs to be addressed, you will receive a phone call with further instructions. ? ?Recommend follow-up with OB/GYN within the next 1 to 2 weeks for further evaluation.  If you do not have a OB/GYN, contact information has been provided for you as well. ? ?You may continue to manage pain/tenderness with the alternation of ibuprofen and Tylenol. ? ?Return to the ED for new or worsening symptoms as discussed. ?

## 2021-11-13 NOTE — ED Triage Notes (Addendum)
Pt states she passed "two balls that looked like a baby in them" 2 weeks ago. States they were the size of a softball and clear with a babies in them. Pt states she is now having dark brown spotting and pain in her vagina.  ? ?Is worried she had a miscarriage as she had one 3 years ago. States that store bought pregnancy tests do not work for her that only blood tests show if she is pregnant. ?

## 2021-11-14 NOTE — ED Provider Notes (Addendum)
?[facility] ?Provider Note ? ? ?CSN: [id number] ?Arrival date & time: 11/13/21  1922 ? ?  ? ?History ? ?Chief Complaint  ?Patient presents with  ? Vaginal Discharge  ? ? ?[name] is a 46 y.o. female with chief complaint of change in vaginal discharge and pelvic pain for the last week.  States 7 days ago she has vaginally passed "2 small clear balls that hold babies in them".  Since then, has had dark brown-burgundy spotting and vaginal/pelvic pain.  LMP one month ago.  Expresses concern of miscarriage since she had a miscarriage about 3 years ago.  Also was told she was pregnant previously post tubal ligation, which was performed at least 15 years ago.  Also continues to have unprotected intercourse with her on-off boyfriend, whom she is unsure of who he interacts with when they're not together.  Has tried tylenol and ibuprofen for pain with minimal relief.  Denies fever, chills, chest pain, shortness of breath, dysuria, or rectal bleeding.  Denies nausea, vomiting, diarrhea, or abdominal pain.  Has not had an STD in the past that she knows of.   ? ?The history is provided by the patient and medical records.  ?Vaginal Discharge ? ?  ? ?Home Medications ?Prior to Admission medications   ?Medication Sig Start Date End Date Taking? Authorizing Provider  ?doxycycline (VIBRAMYCIN) 100 MG capsule Take 1 capsule (100 mg total) by mouth 2 (two) times daily for 14 days. 11/13/21 11/27/21 Yes Prince Rome, PA-C  ?ciprofloxacin-dexamethasone (CIPRODEX) OTIC suspension Place 4 drops into the left ear 2 (two) times daily. For 7 days 03/06/20   Kem Parkinson, PA-C  ?cyclobenzaprine (FLEXERIL) 10 MG tablet Take 1 tablet (10 mg total) by mouth 3 (three) times daily as needed for muscle spasms. ?Patient not taking: Reported on 03/06/2020 01/08/20   Veryl Speak, MD  ?HYDROcodone-acetaminophen (NORCO/VICODIN) 5-325 MG tablet Take 1 tablet by mouth every 6 (six) hours as needed for severe pain. 11/15/20    Joy, Shawn C, PA-C  ?ondansetron (ZOFRAN) 4 MG tablet Take 1 tablet (4 mg total) by mouth every 8 (eight) hours as needed for nausea or vomiting. 11/08/20   Hayden Rasmussen, MD  ?[facility] HFA 108 [phone] Base) MCG/ACT inhaler Inhale 2 puffs into the lungs every 4 (four) hours as needed for shortness of breath. 02/23/20   [provider]  ?   ? ?Allergies    ?Codeine, Nsaids, Pineapple, Sulfa antibiotics, Darvocet [propoxyphene n-acetaminophen], Toradol [ketorolac tromethamine], and Tramadol   ? ?Review of Systems   ?Review of Systems  ?Genitourinary:  Positive for pelvic pain, vaginal bleeding, vaginal discharge and vaginal pain.  ? ?Physical Exam ?Updated Vital Signs ?BP (!) 152/99 (BP Location: Right Arm)   Pulse (!) 105   Temp 98.2 ?F (36.8 ?C) (Oral)   Resp 14   Ht 5\' 7"  (1.702 m)   Wt 106.6 kg   LMP 10/03/2021 (Approximate)   SpO2 98%   BMI 36.81 kg/m?  ?Physical Exam ?Vitals and nursing note reviewed. Exam conducted with a chaperone present.  ?Constitutional:   ?   General: She is not in acute distress. ?   Appearance: She is well-developed.  ?HENT:  ?   Head: Normocephalic and atraumatic.  ?Eyes:  ?   Conjunctiva/sclera: Conjunctivae normal.  ?Cardiovascular:  ?   Rate and Rhythm: Normal rate and regular rhythm.  ?   Pulses: Normal pulses.     ?     Radial pulses are  2+ on the right side and 2+ on the left side.  ?     Dorsalis pedis pulses are 2+ on the right side and 2+ on the left side.  ?   Heart sounds: No murmur heard. ?   Comments: HR varies between 90-105, likely due to anxiety on exam ?Pulmonary:  ?   Effort: Pulmonary effort is normal. No respiratory distress.  ?   Breath sounds: Normal breath sounds.  ?Abdominal:  ?   Palpations: Abdomen is soft.  ?   Tenderness: There is no abdominal tenderness.  ?Genitourinary: ?   General: Normal vulva.  ?   Exam position: Lithotomy position.  ?   Pubic Area: No rash.   ?   Labia:     ?   Right: No rash, tenderness or lesion.     ?   Left: No rash,  tenderness or lesion.   ?   Vagina: Normal.  ?   Cervix: Cervical motion tenderness and friability present. No discharge, lesion or cervical bleeding.  ?   Uterus: Tender.   ?   Adnexa:     ?   Right: Tenderness present.     ?   Left: Tenderness present.   ?Musculoskeletal:     ?   General: No swelling.  ?   Cervical back: Neck supple.  ?   Right lower leg: No edema.  ?   Left lower leg: No edema.  ?Skin: ?   General: Skin is warm and dry.  ?   Capillary Refill: Capillary refill takes less than 2 seconds.  ?Neurological:  ?   Mental Status: She is alert.  ?Psychiatric:     ?   Mood and Affect: Mood is anxious.  ? ? ?ED Results / Procedures / Treatments   ?Labs ?(all labs ordered are listed, but only abnormal results are displayed) ?Labs Reviewed  ?URINALYSIS, ROUTINE W REFLEX MICROSCOPIC - Abnormal; Notable for the following components:  ?    Result Value  ? APPearance CLOUDY (*)   ? Hgb urine dipstick LARGE (*)   ? All other components within normal limits  ?POC URINE PREG, ED - Normal  ?WET PREP, GENITAL  ?GC/CHLAMYDIA PROBE AMP ([place]) NOT AT [facility]  ? ? ?EKG ?None ? ?Radiology ?No results found. ? ?Procedures ?Procedures  ? ? ?Medications Ordered in ED ?Medications  ?metroNIDAZOLE (FLAGYL) tablet 2,000 mg (2,000 mg Oral Given 11/13/21 2301)  ?cefTRIAXone (ROCEPHIN) injection 500 mg (500 mg Intramuscular Given 11/13/21 2302)  ? ? ?ED Course/ Medical Decision Making/ A&P ?  ?                        ?Medical Decision Making ?Amount and/or Complexity of Data Reviewed ?External Data Reviewed: notes. ?Labs: ordered. Decision-making details documented in ED Course. ?Radiology: ordered. ?   Details: To be performed outpatient ?ECG/medicine tests: ordered and independent interpretation performed. Decision-making details documented in ED Course. ? ?Risk ?OTC drugs. ?Prescription drug management. ? ? ?46 y.o. female presents to the ED for concern of Vaginal Discharge ? Marland Kitchen  This involves an extensive number of treatment  options, and is a complaint that carries with it a high risk of complications and morbidity.  The emergent differential diagnosis prior to evaluation includes, but is not limited to: STD, acute cystitis, PID, TOA, renal calculi, menstrual pains, leiomyoma  ? ?This is not an exhaustive differential.  ? ?Past Medical History / Co-morbidities / Social History: ?  Asthma, tubal ligation, cholecystectomy, chronic tobacco use ?Social Determinants of Health include chronic tobacco use and risky sexual behavior for which cessation and safer sex practices counseling provided ? ?Additional History:  ?Internal and external records from outside source obtained and reviewed including prior ED visits ? ?Physical Exam: ?Physical exam performed. The pertinent findings include: Afebrile, mildly tachycardic.  Hemodynamically stable.  Dark burgundy/brown discharge discharge in vaginal canal on exam.  Mild-moderate CMT with friability.  Closed cervical os without blood or discharge exiting.  Cervix without lesions.  Vaginal canal overall unremarkable.  Mild uterine and adnexal tenderness bilaterally ? ?Lab Tests: ?I ordered, and personally interpreted labs.  The pertinent results include:   ?Urine preg: negative ?Wet prep: negative ?UA: large Hgb on dipstick ?G/C: pending ? ?Imaging Studies: ?I ordered imaging studies including pelvic complete ultrasound for outpatient basis.  Pending ? ?Medications: ?I ordered medication including Rocephin and Flagyl for STD prophylaxis/PID treatment.  Reevaluation of the patient after these medicines showed that the patient tolerated these well.  I have reviewed the patients home medicines and have made adjustments as needed ? ?ED Course/Disposition: ?Pt anxious-appearing on exam.  Pt presents with concerns for change in vaginal discharge and pelvic tenderness.  Without urinary symptoms.  Passed two white/clear circular masses from her vagina and has burgundy/brown spotting since, accompanied with  pelvic pain.  Dark spotting likely old blood.  Not suspicious of pregnancy or spontaneous abortion with negative pregnancy test, prior tubal ligation, recent LMP, pelvic exam findings.  Last pap smear unknown.  Urin

## 2021-11-15 LAB — GC/CHLAMYDIA PROBE AMP (~~LOC~~) NOT AT ARMC
Chlamydia: NEGATIVE
Comment: NEGATIVE
Comment: NORMAL
Neisseria Gonorrhea: NEGATIVE

## 2022-05-16 IMAGING — DX DG FOOT COMPLETE 3+V*R*
3 series · 3 of 3 positions shown · non-contrast
Comparison: None.

CLINICAL DATA: 44-year-old female with right heel pain. No known
injury.

EXAM:
RIGHT FOOT COMPLETE - 3+ VIEW

[foot ap]
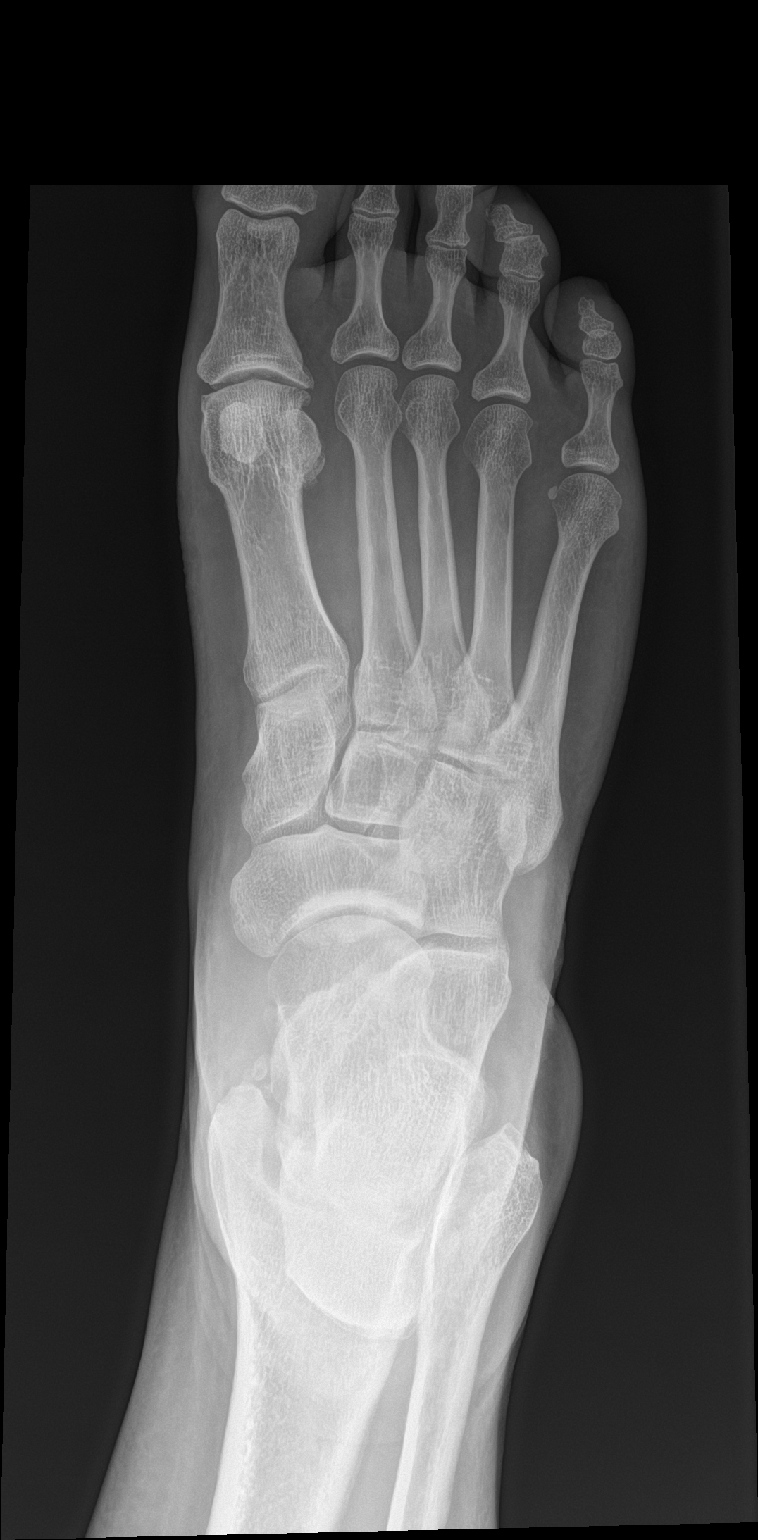

[foot obl]
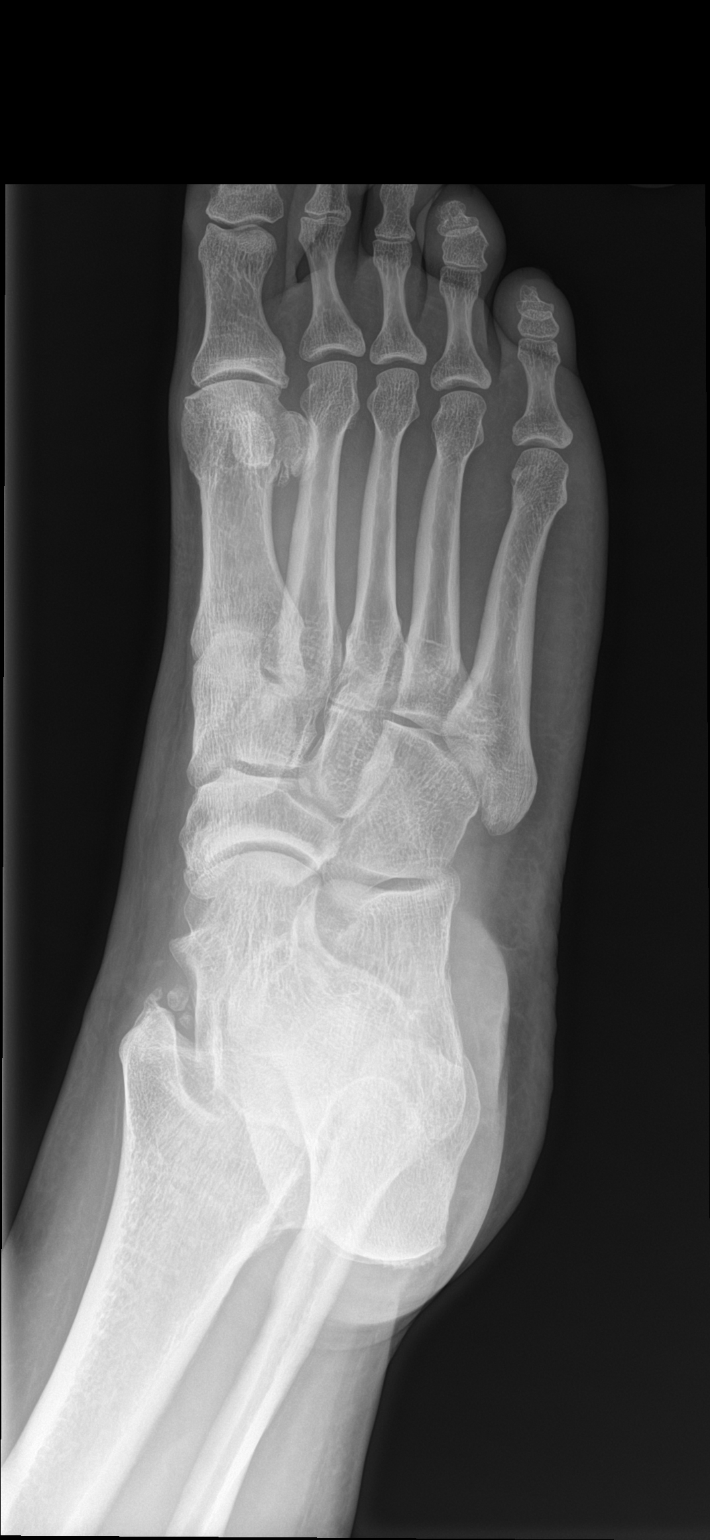

[foot lat]
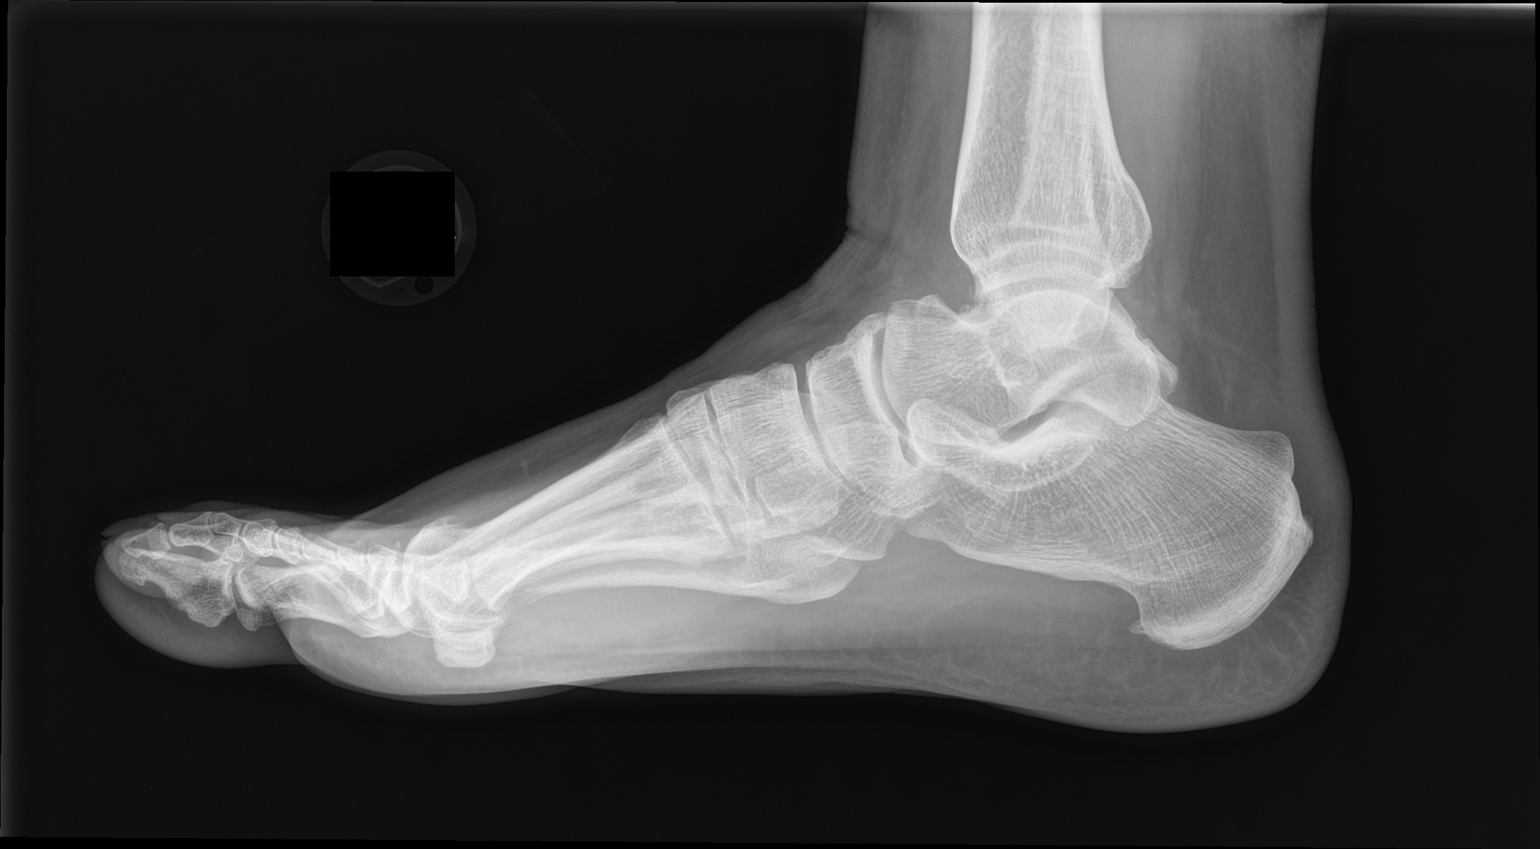

[3 of 3 positions shown; findings below may reference images not displayed]

FINDINGS: There is no acute fracture or dislocation. Degenerative changes of
the medial ankle with small chronic fragmentation. The soft tissues
are unremarkable.
IMPRESSION: Negative.

## 2023-03-03 ENCOUNTER — Emergency Department (HOSPITAL_COMMUNITY)
Admission: EM | Admit: 2023-03-03 | Discharge: 2023-03-03 | Discharge status: Against Medical Advice | Payer: 59 | Attending: Emergency Medicine | Admitting: Emergency Medicine

## 2023-03-03 ENCOUNTER — Other Ambulatory Visit: Payer: Self-pay

## 2023-03-03 ENCOUNTER — Encounter (HOSPITAL_COMMUNITY): Payer: Self-pay | Admitting: Emergency Medicine

## 2023-03-03 DIAGNOSIS — N764 Abscess of vulva: Secondary | ICD-10-CM | POA: Diagnosis not present

## 2023-03-03 DIAGNOSIS — R1900 Intra-abdominal and pelvic swelling, mass and lump, unspecified site: Secondary | ICD-10-CM | POA: Diagnosis not present

## 2023-03-03 DIAGNOSIS — F17210 Nicotine dependence, cigarettes, uncomplicated: Secondary | ICD-10-CM | POA: Diagnosis not present

## 2023-03-03 DIAGNOSIS — Z5321 Procedure and treatment not carried out due to patient leaving prior to being seen by health care provider: Secondary | ICD-10-CM | POA: Insufficient documentation

## 2023-03-03 DIAGNOSIS — Z882 Allergy status to sulfonamides status: Secondary | ICD-10-CM | POA: Diagnosis not present

## 2023-03-03 DIAGNOSIS — L02214 Cutaneous abscess of groin: Secondary | ICD-10-CM | POA: Diagnosis not present

## 2023-03-03 DIAGNOSIS — L0291 Cutaneous abscess, unspecified: Secondary | ICD-10-CM | POA: Diagnosis not present

## 2023-03-03 DIAGNOSIS — Z885 Allergy status to narcotic agent status: Secondary | ICD-10-CM | POA: Diagnosis not present

## 2023-03-03 NOTE — ED Triage Notes (Signed)
PT complains of abscess in right groin x 1 week. Denies any drainage. Pt has been putting OTC ointment for boils with no relief.

## 2023-10-28 ENCOUNTER — Other Ambulatory Visit: Payer: Self-pay

## 2023-10-28 ENCOUNTER — Emergency Department (HOSPITAL_COMMUNITY)
Admission: EM | Admit: 2023-10-28 | Discharge: 2023-10-28 | Disposition: A | Discharge status: Home / Self Care | Attending: Emergency Medicine | Admitting: Emergency Medicine

## 2023-10-28 ENCOUNTER — Emergency Department (HOSPITAL_COMMUNITY)

## 2023-10-28 ENCOUNTER — Encounter (HOSPITAL_COMMUNITY): Payer: Self-pay

## 2023-10-28 DIAGNOSIS — R1013 Epigastric pain: Secondary | ICD-10-CM | POA: Diagnosis present

## 2023-10-28 DIAGNOSIS — K529 Noninfective gastroenteritis and colitis, unspecified: Secondary | ICD-10-CM | POA: Diagnosis not present

## 2023-10-28 DIAGNOSIS — J45909 Unspecified asthma, uncomplicated: Secondary | ICD-10-CM | POA: Diagnosis not present

## 2023-10-28 DIAGNOSIS — F17210 Nicotine dependence, cigarettes, uncomplicated: Secondary | ICD-10-CM | POA: Insufficient documentation

## 2023-10-28 DIAGNOSIS — D72829 Elevated white blood cell count, unspecified: Secondary | ICD-10-CM | POA: Diagnosis not present

## 2023-10-28 LAB — COMPREHENSIVE METABOLIC PANEL WITH GFR
ALT: 17 U/L (ref 0–44)
AST: 17 U/L (ref 15–41)
Albumin: 3.9 g/dL (ref 3.5–5.0)
Alkaline Phosphatase: 66 U/L (ref 38–126)
Anion gap: 10 (ref 5–15)
BUN: 15 mg/dL (ref 6–20)
CO2: 22 mmol/L (ref 22–32)
Calcium: 9.1 mg/dL (ref 8.9–10.3)
Chloride: 107 mmol/L (ref 98–111)
Creatinine, Ser: 0.68 mg/dL (ref 0.44–1.00)
GFR, Estimated: >60 mL/min (ref >60)
Glucose, Bld: 117 mg/dL — ABNORMAL HIGH (ref 70–99)
Potassium: 3.7 mmol/L (ref 3.5–5.1)
Sodium: 139 mmol/L (ref 135–145)
Total Bilirubin: 0.4 mg/dL (ref 0.0–1.2)
Total Protein: 7.3 g/dL (ref 6.5–8.1)

## 2023-10-28 LAB — CBC
HCT: 34.3 % — ABNORMAL LOW (ref 36.0–46.0)
Hemoglobin: 10.5 g/dL — ABNORMAL LOW (ref 12.0–15.0)
MCH: 22.2 pg — ABNORMAL LOW (ref 26.0–34.0)
MCHC: 30.6 g/dL (ref 30.0–36.0)
MCV: 72.4 fL — ABNORMAL LOW (ref 80.0–100.0)
Platelets: 334 10*3/uL (ref 150–400)
RBC: 4.74 MIL/uL (ref 3.87–5.11)
RDW: 18.1 % — ABNORMAL HIGH (ref 11.5–15.5)
WBC: 13.3 10*3/uL — ABNORMAL HIGH (ref 4.0–10.5)
nRBC: 0 % (ref 0.0–0.2)

## 2023-10-28 LAB — CBG MONITORING, ED: Glucose-Capillary: 138 mg/dL — ABNORMAL HIGH (ref 70–99)

## 2023-10-28 LAB — LIPASE, BLOOD: Lipase: 29 U/L (ref 11–51)

## 2023-10-28 MED ORDER — FAMOTIDINE IN NACL 20-0.9 MG/50ML-% IV SOLN
20.0000 mg | Freq: Once | INTRAVENOUS | Status: AC
  Administered 2023-10-28: 20 mg via INTRAVENOUS
  Filled 2023-10-28: qty 50

## 2023-10-28 MED ORDER — ONDANSETRON 4 MG PO TBDP: 4.0000 mg | ORAL_TABLET | Freq: Three times a day (TID) | ORAL | 0 refills | Status: DC | PRN

## 2023-10-28 MED ORDER — ALUM & MAG HYDROXIDE-SIMETH 200-200-20 MG/5ML PO SUSP
30.0000 mL | Freq: Once | ORAL | Status: AC
  Administered 2023-10-28: 30 mL via ORAL
  Filled 2023-10-28: qty 30

## 2023-10-28 MED ORDER — IOHEXOL 300 MG/ML  SOLN
100.0000 mL | Freq: Once | INTRAMUSCULAR | Status: AC | PRN
  Administered 2023-10-28: 100 mL via INTRAVENOUS

## 2023-10-28 MED ORDER — ONDANSETRON HCL 4 MG/2ML IJ SOLN
4.0000 mg | Freq: Once | INTRAMUSCULAR | Status: AC
  Administered 2023-10-28: 4 mg via INTRAVENOUS
  Filled 2023-10-28: qty 2

## 2023-10-28 MED ORDER — SODIUM CHLORIDE 0.9 % IV BOLUS
1000.0000 mL | Freq: Once | INTRAVENOUS | Status: AC
  Administered 2023-10-28: 1000 mL via INTRAVENOUS

## 2023-10-28 MED ORDER — AMOXICILLIN-POT CLAVULANATE 875-125 MG PO TABS: 1.0000 | ORAL_TABLET | Freq: Two times a day (BID) | ORAL | 0 refills | Status: AC

## 2023-10-28 MED ORDER — DICYCLOMINE HCL 20 MG PO TABS: 20.0000 mg | ORAL_TABLET | Freq: Two times a day (BID) | ORAL | 0 refills | Status: DC

## 2023-10-28 MED ORDER — HYDROMORPHONE HCL 1 MG/ML IJ SOLN
1.0000 mg | Freq: Once | INTRAMUSCULAR | Status: AC
  Administered 2023-10-28: 1 mg via INTRAVENOUS
  Filled 2023-10-28: qty 1

## 2023-10-28 MED ORDER — MORPHINE SULFATE (PF) 4 MG/ML IV SOLN
4.0000 mg | Freq: Once | INTRAVENOUS | Status: AC
  Administered 2023-10-28: 4 mg via INTRAVENOUS
  Filled 2023-10-28: qty 1

## 2023-10-28 NOTE — ED Triage Notes (Signed)
 Pt to ED via [facility] for abdominal pain beginning 3 hours pta. Mid-abd radiating upwards to epigastric. Emesis x1, denies diarrhea. Does not have gallbladder. Started after she had a cheeseburger for dinner.

## 2023-10-28 NOTE — ED Provider Notes (Signed)
 [facility]-EMERGENCY DEPT [facility] Emergency Department Provider Note MRN:  [id number]  Arrival date & time: 10/28/23     Chief Complaint   Abdominal Pain   History of Present Illness   [name] is a 48 y.o. year-old female with a history of asthma, cholecystectomy presenting to the ED with chief complaint of abdominal pain.  Acute epigastric abdominal pain at about 4 PM today, not going away.  Nausea but no vomiting, no diarrhea.  No lower abdominal pain, no fever.  Review of Systems  A thorough review of systems was obtained and all systems are negative except as noted in the HPI and PMH.   Patient's Health History    Past Medical History:  Diagnosis Date   Asthma    Bronchitis     Past Surgical History:  Procedure Laterality Date   CHOLECYSTECTOMY     TUBAL LIGATION      History reviewed. No pertinent family history.  Social History   Socioeconomic History   Marital status: Married    Spouse name: Not on file   Number of children: Not on file   Years of education: Not on file   Highest education level: Not on file  Occupational History   Not on file  Tobacco Use   Smoking status: Every Day    Current packs/day: 0.50    Types: Cigarettes   Smokeless tobacco: Never  Vaping Use   Vaping status: Never Used  Substance and Sexual Activity   Alcohol use: No   Drug use: No   Sexual activity: Yes  Other Topics Concern   Not on file  Social History Narrative   Not on file   Social Drivers of Health   Financial Resource Strain: Not on file  Food Insecurity: Not on file  Transportation Needs: Not on file  Physical Activity: Not on file  Stress: Not on file  Social Connections: Unknown (03/21/2022)   Received from [facility]   Social Network    Social Network: Not on file  Intimate Partner Violence: Unknown (03/21/2022)   Received from [facility]   HITS    Physically Hurt: Not on file    Insult or Talk Down To: Not on file    Threaten  Physical Harm: Not on file    Scream or Curse: Not on file     Physical Exam   Vitals:   10/28/23 0233 10/28/23 0245  BP:    Pulse:  82  Resp:  14  Temp: (!) 97.5 F (36.4 C)   SpO2:  98%    CONSTITUTIONAL: Well-appearing, NAD NEURO/PSYCH:  Alert and oriented x 3, no focal deficits EYES:  eyes equal and reactive ENT/NECK:  no LAD, no JVD CARDIO: Regular rate, well-perfused, normal S1 and S2 PULM:  CTAB no wheezing or rhonchi GI/GU:  non-distended, moderate epigastric tenderness to palpation MSK/SPINE:  No gross deformities, no edema SKIN:  no rash, atraumatic   *Additional and/or pertinent findings included in MDM below  Diagnostic and Interventional Summary    EKG Interpretation Date/Time:    Ventricular Rate:    PR Interval:    QRS Duration:    QT Interval:    QTC Calculation:   R Axis:      Text Interpretation:         Labs Reviewed  CBC - Abnormal; Notable for the following components:      Result Value   WBC 13.3 (*)    Hemoglobin 10.5 (*)  HCT 34.3 (*)    MCV 72.4 (*)    MCH 22.2 (*)    RDW 18.1 (*)    All other components within normal limits  COMPREHENSIVE METABOLIC PANEL WITH GFR - Abnormal; Notable for the following components:   Glucose, Bld 117 (*)    All other components within normal limits  CBG MONITORING, ED - Abnormal; Notable for the following components:   Glucose-Capillary 138 (*)    All other components within normal limits  LIPASE, BLOOD    CT ABDOMEN PELVIS W CONTRAST  Final Result      Medications  sodium chloride 0.9 % bolus 1,000 mL (0 mLs Intravenous Stopped 10/28/23 0233)  ondansetron (ZOFRAN) injection 4 mg (4 mg Intravenous Given 10/28/23 0148)  morphine (PF) 4 MG/ML injection 4 mg (4 mg Intravenous Given 10/28/23 0148)  famotidine (PEPCID) IVPB 20 mg premix (0 mg Intravenous Stopped 10/28/23 0234)  alum & mag hydroxide-simeth (MAALOX/MYLANTA) 200-200-20 MG/5ML suspension 30 mL (30 mLs Oral Given 10/28/23 0148)   HYDROmorphone (DILAUDID) injection 1 mg (1 mg Intravenous Given 10/28/23 0241)  iohexol (OMNIPAQUE) 300 MG/ML solution 100 mL (100 mLs Intravenous Contrast Given 10/28/23 0246)     Procedures  /  Critical Care Procedures  ED Course and Medical Decision Making  Initial Impression and Ddx Differential diagnosis includes pancreatitis, gastritis, choledocholithiasis, doubt perforation as there is no rebound, guarding, rigidity or evidence of peritonitis at this time.  Attempting symptomatic management, checking labs, will reassess.  Past medical/surgical history that increases complexity of ED encounter: Cholecystectomy  Interpretation of Diagnostics I personally reviewed the Laboratory Testing and my interpretation is as follows: Minimal leukocytosis, otherwise no significant blood count or electrolyte disturbance.  CT revealing evidence of inflammatory or infectious enteritis, also some evidence to suggest partial SBO.  Patient Reassessment and Ultimate Disposition/Management     Patient is feeling a lot better, tolerating p.o. without issue.  Has been having normal bowel movements and is fairly recently passing flatus.  Clinically does not really fit with SBO.  Favoring gastroenteritis appropriate for outpatient management, strict return precautions.  Patient management required discussion with the following services or consulting groups:  None  Complexity of Problems Addressed Acute illness or injury that poses threat of life of bodily function  Additional Data Reviewed and Analyzed Further history obtained from: None  Additional Factors Impacting ED Encounter Risk Consideration of hospitalization  Elmer Sow. Pilar Plate, MD [facility] Emergency Medicine [facility] [email or web address]  Final Clinical Impressions(s) / ED Diagnoses     ICD-10-CM   1. Enteritis  K52.9       ED Discharge Orders          Ordered    amoxicillin-clavulanate (AUGMENTIN) 875-125 MG  tablet  Every 12 hours        10/28/23 0452    dicyclomine (BENTYL) 20 MG tablet  2 times daily        10/28/23 0452    ondansetron (ZOFRAN-ODT) 4 MG disintegrating tablet  Every 8 hours PRN        10/28/23 0452             Discharge Instructions Discussed with and Provided to Patient:     Discharge Instructions      You were evaluated in the Emergency Department and after careful evaluation, we did not find any emergent condition requiring admission or further testing in the hospital.  Your exam/testing today is overall reassuring.  Symptoms seem to be due to a stomach  bug.  Take the Augmentin antibiotic as directed to help treat any bacterial infection.  Use the Zofran as needed for nausea, use the Bentyl as needed for abdominal pain.  Limit your diet to liquids and broths as we discussed.  Please return to the Emergency Department if you experience any worsening of your condition.   Thank you for allowing [place] to be a part of your care.       Sabas Sous, MD 10/28/23 [phone]

## 2023-10-28 NOTE — Discharge Instructions (Signed)
 You were evaluated in the Emergency Department and after careful evaluation, we did not find any emergent condition requiring admission or further testing in the hospital.  Your exam/testing today is overall reassuring.  Symptoms seem to be due to a stomach bug.  Take the Augmentin antibiotic as directed to help treat any bacterial infection.  Use the Zofran as needed for nausea, use the Bentyl as needed for abdominal pain.  Limit your diet to liquids and broths as we discussed.  Please return to the Emergency Department if you experience any worsening of your condition.   Thank you for allowing [place] to be a part of your care.

## 2024-05-05 ENCOUNTER — Encounter (HOSPITAL_COMMUNITY): Payer: Self-pay | Admitting: Emergency Medicine

## 2024-05-05 ENCOUNTER — Emergency Department (HOSPITAL_COMMUNITY)
Admission: EM | Admit: 2024-05-05 | Discharge: 2024-05-06 | Disposition: A | Discharge status: Home / Self Care | Attending: Emergency Medicine | Admitting: Emergency Medicine

## 2024-05-05 ENCOUNTER — Emergency Department (HOSPITAL_COMMUNITY)

## 2024-05-05 DIAGNOSIS — J45909 Unspecified asthma, uncomplicated: Secondary | ICD-10-CM | POA: Diagnosis not present

## 2024-05-05 DIAGNOSIS — R0781 Pleurodynia: Secondary | ICD-10-CM | POA: Diagnosis not present

## 2024-05-05 DIAGNOSIS — D72829 Elevated white blood cell count, unspecified: Secondary | ICD-10-CM | POA: Diagnosis not present

## 2024-05-05 DIAGNOSIS — F17210 Nicotine dependence, cigarettes, uncomplicated: Secondary | ICD-10-CM | POA: Diagnosis not present

## 2024-05-05 DIAGNOSIS — R739 Hyperglycemia, unspecified: Secondary | ICD-10-CM | POA: Insufficient documentation

## 2024-05-05 DIAGNOSIS — R0789 Other chest pain: Secondary | ICD-10-CM | POA: Diagnosis present

## 2024-05-05 MED ORDER — ACETAMINOPHEN 325 MG PO TABS
650.0000 mg | ORAL_TABLET | Freq: Once | ORAL | Status: AC
  Administered 2024-05-06: 650 mg via ORAL
  Filled 2024-05-05: qty 2

## 2024-05-05 NOTE — ED Provider Notes (Signed)
 [place] EMERGENCY DEPARTMENT AT [facility] Provider Note   CSN: [id number] Arrival date & time: 05/05/24  2314     Patient presents with: Shortness of Breath   [name] is a 48 y.o. female.  {Add pertinent medical, surgical, social history, OB history to YEP:67052} The history is provided by the patient.  Shortness of Breath  She has history of asthma and comes in complaining of sharp pain in the bilateral lateral chest wall which started about 2 hours ago.  Pain is worse with deep breath, not affected by movement.  She denies cough, fever, chills.  She denies dyspnea, nausea, diaphoresis.  She does smoke but states she is down to 3 cigarettes a day, denies history of hypertension or diabetes or hyperlipidemia.  There is no family history of premature coronary atherosclerosis.  Unfortunately, she is allergic to NSAIDs.  I have ordered electrocardiogram, chest x-ray and screening labs, acetaminophen  for pain.    Prior to Admission medications   Medication Sig Start Date End Date Taking? Authorizing Provider  ciprofloxacin -dexamethasone  (CIPRODEX ) OTIC suspension Place 4 drops into the left ear 2 (two) times daily. For 7 days 03/06/20   Triplett, Tammy, PA-C  cyclobenzaprine  (FLEXERIL ) 10 MG tablet Take 1 tablet (10 mg total) by mouth 3 (three) times daily as needed for muscle spasms. Patient not taking: Reported on 03/06/2020 01/08/20   Geroldine Berg, MD  dicyclomine  (BENTYL ) 20 MG tablet Take 1 tablet (20 mg total) by mouth 2 (two) times daily. 10/28/23   Theadore Ozell HERO, MD  HYDROcodone -acetaminophen  (NORCO/VICODIN) 5-325 MG tablet Take 1 tablet by mouth every 6 (six) hours as needed for severe pain. 11/15/20   Joy, Shawn C, PA-C  ondansetron  (ZOFRAN -ODT) 4 MG disintegrating tablet Take 1 tablet (4 mg total) by mouth every 8 (eight) hours as needed for nausea or vomiting. 10/28/23   Theadore Ozell HERO, MD  PROAIR  HFA 108 (90 Base) MCG/ACT inhaler Inhale 2 puffs into the lungs  every 4 (four) hours as needed for shortness of breath. 02/23/20   [provider]    Allergies: Codeine, Nsaids, Pineapple, Sulfa antibiotics, Darvocet [propoxyphene n-acetaminophen ], Toradol  [ketorolac  tromethamine ], and Tramadol    Review of Systems  Respiratory:  Positive for shortness of breath.   All other systems reviewed and are negative.   Updated Vital Signs BP (!) 136/112 (BP Location: Left Arm)   Pulse 96   Temp 98.3 F (36.8 C) (Oral)   Resp (!) 22   Ht 5' 7 (1.702 m)   Wt 104.3 kg   SpO2 96%   BMI 36.02 kg/m   Physical Exam Vitals and nursing note reviewed.   48 year old female, resting comfortably and in no acute distress. Vital signs are significant for elevated blood pressure and slightly elevated respiratory rate. Oxygen saturation is 96%, which is normal. Head is normocephalic and atraumatic. PERRLA, EOMI.  Back is nontender. Lungs are clear without rales, wheezes, or rhonchi. Chest is tender along the lateral chest wall bilaterally-left greater than right.  There is no crepitus. Heart has regular rate and rhythm without murmur. Abdomen is soft, flat, nontender. Extremities have no cyanosis or edema, full range of motion is present. Skin is warm and dry without rash. Neurologic: Mental status is normal, moves all extremities equally.  (all labs ordered are listed, but only abnormal results are displayed) Labs Reviewed  BASIC METABOLIC PANEL WITH GFR  CBC WITH DIFFERENTIAL/PLATELET  D-DIMER, QUANTITATIVE  TROPONIN T, HIGH SENSITIVITY  EKG: None  Radiology: No results found.  {Document cardiac monitor, telemetry assessment procedure when appropriate:32947} Procedures   Medications Ordered in the ED  acetaminophen  (TYLENOL ) tablet 650 mg (has no administration in time range)      {Click here for ABCD2, HEART and other calculators REFRESH Note before signing:1}                              Medical Decision Making Amount and/or  Complexity of Data Reviewed Labs: ordered. Radiology: ordered.  Risk OTC drugs.   Chest pain which seems most likely to be chest wall pain based on tenderness to palpation.  Differential diagnosis includes, but is not limited to, pneumonia, pulmonary embolism, ACS.  I have ordered workup including chest x-ray and electrocardiogram.  She is allergic to NSAIDs, I have ordered acetaminophen  for pain.  I have reviewed her past records, and note ED visit on 03/26/2022 for chest wall pain.  {Document critical care time when appropriate  Document review of labs and clinical decision tools ie CHADS2VASC2, etc  Document your independent review of radiology images and any outside records  Document your discussion with family members, caretakers and with consultants  Document social determinants of health affecting pt's care  Document your decision making why or why not admission, treatments were needed:32947:::1}   Final diagnoses:  None    ED Discharge Orders     None

## 2024-05-05 NOTE — ED Triage Notes (Signed)
 Pt arrives via EMS from home with c/o SHOB, pain with inspiration, and bilateral pain under both arms starting approx 2 hrs ago while laying in bed. Hx asthma. EMS report placing 2 L [place] for comfort.

## 2024-05-05 NOTE — ED Notes (Signed)
 ED Provider at bedside.

## 2024-05-06 ENCOUNTER — Emergency Department (HOSPITAL_COMMUNITY)

## 2024-05-06 LAB — CBC WITH DIFFERENTIAL/PLATELET
Abs Immature Granulocytes: 0.12 K/uL — ABNORMAL HIGH (ref 0.00–0.07)
Basophils Absolute: 0.1 K/uL (ref 0.0–0.1)
Basophils Relative: 1 %
Eosinophils Absolute: 0.2 K/uL (ref 0.0–0.5)
Eosinophils Relative: 1 %
HCT: 42.8 % (ref 36.0–46.0)
Hemoglobin: 13.6 g/dL (ref 12.0–15.0)
Immature Granulocytes: 1 %
Lymphocytes Relative: 18 %
Lymphs Abs: 3 K/uL (ref 0.7–4.0)
MCH: 24.9 pg — ABNORMAL LOW (ref 26.0–34.0)
MCHC: 31.8 g/dL (ref 30.0–36.0)
MCV: 78.4 fL — ABNORMAL LOW (ref 80.0–100.0)
Monocytes Absolute: 1.2 K/uL — ABNORMAL HIGH (ref 0.1–1.0)
Monocytes Relative: 8 %
Neutro Abs: 11.7 K/uL — ABNORMAL HIGH (ref 1.7–7.7)
Neutrophils Relative %: 71 %
Platelets: 359 K/uL (ref 150–400)
RBC: 5.46 MIL/uL — ABNORMAL HIGH (ref 3.87–5.11)
RDW: 17.3 % — ABNORMAL HIGH (ref 11.5–15.5)
WBC: 16.3 K/uL — ABNORMAL HIGH (ref 4.0–10.5)
nRBC: 0 % (ref 0.0–0.2)

## 2024-05-06 LAB — TROPONIN T, HIGH SENSITIVITY
Troponin T High Sensitivity: <15 ng/L (ref 0–19)
Troponin T High Sensitivity: <15 ng/L (ref 0–19)

## 2024-05-06 LAB — BASIC METABOLIC PANEL WITH GFR
Anion gap: 9 (ref 5–15)
BUN: 15 mg/dL (ref 6–20)
CO2: 24 mmol/L (ref 22–32)
Calcium: 8.7 mg/dL — ABNORMAL LOW (ref 8.9–10.3)
Chloride: 106 mmol/L (ref 98–111)
Creatinine, Ser: 0.92 mg/dL (ref 0.44–1.00)
GFR, Estimated: >60 mL/min (ref >60)
Glucose, Bld: 110 mg/dL — ABNORMAL HIGH (ref 70–99)
Potassium: 3.7 mmol/L (ref 3.5–5.1)
Sodium: 138 mmol/L (ref 135–145)

## 2024-05-06 LAB — D-DIMER, QUANTITATIVE: D-Dimer, Quant: 0.84 ug{FEU}/mL — ABNORMAL HIGH (ref 0.00–0.50)

## 2024-05-06 MED ORDER — DEXAMETHASONE SOD PHOSPHATE PF 10 MG/ML IJ SOLN
10.0000 mg | Freq: Once | INTRAMUSCULAR | Status: AC
  Administered 2024-05-06: 10 mg via INTRAVENOUS

## 2024-05-06 MED ORDER — HYDROMORPHONE HCL 1 MG/ML IJ SOLN
0.5000 mg | Freq: Once | INTRAMUSCULAR | Status: AC
Start: 2024-05-06 — End: 2024-05-06
  Administered 2024-05-06: 0.5 mg via INTRAVENOUS
  Filled 2024-05-06: qty 0.5

## 2024-05-06 MED ORDER — IOHEXOL 350 MG/ML SOLN
75.0000 mL | Freq: Once | INTRAVENOUS | Status: AC | PRN
  Administered 2024-05-06: 75 mL via INTRAVENOUS

## 2024-05-06 MED ORDER — OXYCODONE HCL 5 MG PO TABS: 5.0000 mg | ORAL_TABLET | ORAL | 0 refills | Status: DC | PRN

## 2024-05-06 MED ORDER — OXYCODONE-ACETAMINOPHEN 5-325 MG PO TABS
1.0000 | ORAL_TABLET | Freq: Once | ORAL | Status: AC
  Administered 2024-05-06: 1 via ORAL
  Filled 2024-05-06: qty 1

## 2024-05-06 MED ORDER — ONDANSETRON HCL 4 MG/2ML IJ SOLN
4.0000 mg | Freq: Once | INTRAMUSCULAR | Status: AC
  Administered 2024-05-06: 4 mg via INTRAVENOUS
  Filled 2024-05-06: qty 2

## 2024-05-06 MED ORDER — HYDROMORPHONE HCL 1 MG/ML IJ SOLN
0.5000 mg | Freq: Once | INTRAMUSCULAR | Status: AC
  Administered 2024-05-06: 0.5 mg via INTRAVENOUS
  Filled 2024-05-06: qty 0.5

## 2024-05-06 NOTE — ED Notes (Signed)
 Patient transported to CT

## 2024-05-06 NOTE — Discharge Instructions (Signed)
 The cause for your pain is not clear.  It may be a viral infection of the lining of the lung called pleurisy.  Normally, you would use anti-inflammatory medication like ibuprofen.  Since you cannot take ibuprofen, the treatment is pain control.  You may take acetaminophen  as needed for pain, add oxycodone  as needed for pain not relieved by acetaminophen .  Return to the emergency department if symptoms are worsening.

## 2024-07-12 ENCOUNTER — Other Ambulatory Visit: Payer: Self-pay

## 2024-07-12 ENCOUNTER — Encounter (HOSPITAL_COMMUNITY): Payer: Self-pay

## 2024-07-12 ENCOUNTER — Emergency Department (HOSPITAL_COMMUNITY)
Admission: EM | Admit: 2024-07-12 | Discharge: 2024-07-12 | Disposition: A | Discharge status: Home / Self Care | Attending: Emergency Medicine | Admitting: Emergency Medicine

## 2024-07-12 ENCOUNTER — Emergency Department (HOSPITAL_COMMUNITY)

## 2024-07-12 DIAGNOSIS — N9489 Other specified conditions associated with female genital organs and menstrual cycle: Secondary | ICD-10-CM | POA: Insufficient documentation

## 2024-07-12 DIAGNOSIS — R10A3 Flank pain, bilateral: Secondary | ICD-10-CM | POA: Diagnosis present

## 2024-07-12 DIAGNOSIS — R000 Tachycardia, unspecified: Secondary | ICD-10-CM | POA: Insufficient documentation

## 2024-07-12 LAB — CBC WITH DIFFERENTIAL/PLATELET
Abs Immature Granulocytes: 0.08 K/uL — ABNORMAL HIGH (ref 0.00–0.07)
Basophils Absolute: 0.1 K/uL (ref 0.0–0.1)
Basophils Relative: 1 %
Eosinophils Absolute: 0.1 K/uL (ref 0.0–0.5)
Eosinophils Relative: 1 %
HCT: 42.9 % (ref 36.0–46.0)
Hemoglobin: 13.4 g/dL (ref 12.0–15.0)
Immature Granulocytes: 1 %
Lymphocytes Relative: 19 %
Lymphs Abs: 2.6 K/uL (ref 0.7–4.0)
MCH: 24 pg — ABNORMAL LOW (ref 26.0–34.0)
MCHC: 31.2 g/dL (ref 30.0–36.0)
MCV: 76.7 fL — ABNORMAL LOW (ref 80.0–100.0)
Monocytes Absolute: 1 K/uL (ref 0.1–1.0)
Monocytes Relative: 8 %
Neutro Abs: 9.6 K/uL — ABNORMAL HIGH (ref 1.7–7.7)
Neutrophils Relative %: 70 %
Platelets: 340 K/uL (ref 150–400)
RBC: 5.59 MIL/uL — ABNORMAL HIGH (ref 3.87–5.11)
RDW: 18.2 % — ABNORMAL HIGH (ref 11.5–15.5)
WBC: 13.6 K/uL — ABNORMAL HIGH (ref 4.0–10.5)
nRBC: 0 % (ref 0.0–0.2)

## 2024-07-12 LAB — COMPREHENSIVE METABOLIC PANEL WITH GFR
ALT: 11 U/L (ref 0–44)
AST: 13 U/L — ABNORMAL LOW (ref 15–41)
Albumin: 3.5 g/dL (ref 3.5–5.0)
Alkaline Phosphatase: 52 U/L (ref 38–126)
Anion gap: 6 (ref 5–15)
BUN: 13 mg/dL (ref 6–20)
CO2: 28 mmol/L (ref 22–32)
Calcium: 8.4 mg/dL — ABNORMAL LOW (ref 8.9–10.3)
Chloride: 105 mmol/L (ref 98–111)
Creatinine, Ser: 0.93 mg/dL (ref 0.44–1.00)
GFR, Estimated: >60 mL/min
Glucose, Bld: 95 mg/dL (ref 70–99)
Potassium: 4.1 mmol/L (ref 3.5–5.1)
Sodium: 139 mmol/L (ref 135–145)
Total Bilirubin: 0.3 mg/dL (ref 0.0–1.2)
Total Protein: 5.3 g/dL — ABNORMAL LOW (ref 6.5–8.1)

## 2024-07-12 LAB — LACTIC ACID, PLASMA: Lactic Acid, Venous: 1 mmol/L (ref 0.5–1.9)

## 2024-07-12 LAB — LIPASE, BLOOD: Lipase: 21 U/L (ref 11–51)

## 2024-07-12 MED ORDER — HYDROCODONE-ACETAMINOPHEN 5-325 MG PO TABS
1.0000 | ORAL_TABLET | Freq: Once | ORAL | Status: AC
  Administered 2024-07-12: 1 via ORAL
  Filled 2024-07-12: qty 1

## 2024-07-12 MED ORDER — IOHEXOL 300 MG/ML  SOLN
100.0000 mL | Freq: Once | INTRAMUSCULAR | Status: AC | PRN
  Administered 2024-07-12: 100 mL via INTRAVENOUS

## 2024-07-12 MED ORDER — DICYCLOMINE HCL 20 MG PO TABS: 20.0000 mg | ORAL_TABLET | Freq: Two times a day (BID) | ORAL | 0 refills | Status: AC

## 2024-07-12 MED ORDER — PANTOPRAZOLE SODIUM 40 MG PO TBEC: 40.0000 mg | DELAYED_RELEASE_TABLET | Freq: Every day | ORAL | 0 refills | Status: AC

## 2024-07-12 MED ORDER — HYDROCODONE-ACETAMINOPHEN 5-325 MG PO TABS: 1.0000 | ORAL_TABLET | Freq: Four times a day (QID) | ORAL | 0 refills | Status: DC | PRN

## 2024-07-12 MED ORDER — FENTANYL CITRATE (PF) 100 MCG/2ML IJ SOLN
50.0000 ug | Freq: Once | INTRAMUSCULAR | Status: AC
  Administered 2024-07-12: 50 ug via INTRAVENOUS
  Filled 2024-07-12: qty 2

## 2024-07-12 MED ORDER — SODIUM CHLORIDE 0.9 % IV BOLUS
1000.0000 mL | Freq: Once | INTRAVENOUS | Status: AC
  Administered 2024-07-12: 1000 mL via INTRAVENOUS

## 2024-07-12 NOTE — ED Notes (Signed)
 Patient transported to CT

## 2024-07-12 NOTE — ED Provider Notes (Signed)
 " [place] EMERGENCY DEPARTMENT AT [facility] Provider Note   CSN: [id number] Arrival date & time: 07/12/24  8151     Patient presents with: side pain   [name] is a 48 y.o. female.  She is here with a complaint of pain in her abdominal sides and flank that have been going on for about a year.  She said it would come and go but more recently it has been steady.  It is severe at times and takes her breath away.  She said she has been worked up for it before but nobody can find a reason.  It does not sound like anybody is really done any significant testing recently.  She does not have a primary care doctor and has not seen any specialist regarding this.  No fevers chills nausea vomiting.  Having normal bowel movements.  No urinary symptoms.  Prior history of a cholecystectomy and tubal ligation.  {Add pertinent medical, surgical, social history, OB history to YEP:67052} The history is provided by the patient.  Abdominal Pain Pain location:  L flank and R flank Pain quality: aching   Pain severity:  Severe Onset quality:  Gradual Timing:  Intermittent Progression:  Worsening Relieved by:  Nothing Associated symptoms: no chest pain, no constipation, no cough, no diarrhea, no dysuria, no fever, no hematemesis, no hematochezia, no hematuria, no nausea, no shortness of breath and no vomiting        Prior to Admission medications  Medication Sig Start Date End Date Taking? Authorizing Provider  cyclobenzaprine  (FLEXERIL ) 10 MG tablet Take 1 tablet (10 mg total) by mouth 3 (three) times daily as needed for muscle spasms. Patient not taking: Reported on 03/06/2020 01/08/20   Geroldine Berg, MD  dicyclomine  (BENTYL ) 20 MG tablet Take 1 tablet (20 mg total) by mouth 2 (two) times daily. 10/28/23   Theadore Ozell HERO, MD  ondansetron  (ZOFRAN -ODT) 4 MG disintegrating tablet Take 1 tablet (4 mg total) by mouth every 8 (eight) hours as needed for nausea or vomiting. 10/28/23   Theadore Ozell HERO, MD  oxyCODONE  (ROXICODONE ) 5 MG immediate release tablet Take 1 tablet (5 mg total) by mouth every 4 (four) hours as needed for severe pain (pain score 7-10). 05/06/24   Raford Lenis, MD  PROAIR  HFA 108 [phone] Base) MCG/ACT inhaler Inhale 2 puffs into the lungs every 4 (four) hours as needed for shortness of breath. 02/23/20   [provider]    Allergies: Codeine, Nsaids, Pineapple, Sulfa antibiotics, Darvocet [propoxyphene n-acetaminophen ], Toradol  [ketorolac  tromethamine ], and Tramadol    Review of Systems  Constitutional:  Negative for fever.  Respiratory:  Negative for cough and shortness of breath.   Cardiovascular:  Negative for chest pain.  Gastrointestinal:  Positive for abdominal pain. Negative for constipation, diarrhea, hematemesis, hematochezia, nausea and vomiting.  Genitourinary:  Negative for dysuria and hematuria.    Updated Vital Signs BP 122/77 (BP Location: Right Arm)   Pulse (!) 124   Temp 98.5 F (36.9 C)   Resp 18   Wt 104.3 kg   SpO2 98%   BMI 36.01 kg/m   Physical Exam Vitals and nursing note reviewed.  Constitutional:      General: She is not in acute distress.    Appearance: Normal appearance. She is well-developed.  HENT:     Head: Normocephalic and atraumatic.  Eyes:     Conjunctiva/sclera: Conjunctivae normal.  Cardiovascular:     Rate and Rhythm: Regular rhythm. Tachycardia present.  Heart sounds: No murmur heard. Pulmonary:     Effort: Pulmonary effort is normal. No respiratory distress.     Breath sounds: Normal breath sounds. No stridor. No wheezing.  Abdominal:     Palpations: Abdomen is soft.     Tenderness: There is no abdominal tenderness. There is no guarding or rebound.  Musculoskeletal:        General: No deformity.     Cervical back: Neck supple.  Skin:    General: Skin is warm and dry.  Neurological:     General: No focal deficit present.     Mental Status: She is alert.     GCS: GCS eye subscore is 4.  GCS verbal subscore is 5. GCS motor subscore is 6.     Sensory: No sensory deficit.     Motor: No weakness.     (all labs ordered are listed, but only abnormal results are displayed) Labs Reviewed  COMPREHENSIVE METABOLIC PANEL WITH GFR  LIPASE, BLOOD  CBC WITH DIFFERENTIAL/PLATELET  URINALYSIS, ROUTINE W REFLEX MICROSCOPIC  LACTIC ACID, PLASMA  LACTIC ACID, PLASMA    EKG: None  Radiology: No results found.  {Document cardiac monitor, telemetry assessment procedure when appropriate:32947} Procedures   Medications Ordered in the ED  sodium chloride  0.9 % bolus 1,000 mL (has no administration in time range)  fentaNYL  (SUBLIMAZE ) injection 50 mcg (has no administration in time range)      {Click here for ABCD2, HEART and other calculators REFRESH Note before signing:1}                              Medical Decision Making Amount and/or Complexity of Data Reviewed Labs: ordered. Radiology: ordered.  Risk Prescription drug management.   This patient complains of ***; this involves an extensive number of treatment Options and is a complaint that carries with it a high risk of complications and morbidity. The differential includes ***  I ordered, reviewed and interpreted labs, which included *** I ordered medication *** and reviewed PMP when indicated. I ordered imaging studies which included *** and I independently    visualized and interpreted imaging which showed *** Additional history obtained from *** Previous records obtained and reviewed *** I consulted *** and discussed lab and imaging findings and discussed disposition.  Cardiac monitoring reviewed, *** Social determinants considered, *** Critical Interventions: ***  After the interventions stated above, I reevaluated the patient and found *** Admission and further testing considered, ***   {Document critical care time when appropriate  Document review of labs and clinical decision tools ie CHADS2VASC2,  etc  Document your independent review of radiology images and any outside records  Document your discussion with family members, caretakers and with consultants  Document social determinants of health affecting pt's care  Document your decision making why or why not admission, treatments were needed:32947:::1}   Final diagnoses:  None    ED Discharge Orders     None        "

## 2024-07-12 NOTE — Discharge Instructions (Signed)
 You were seen in the emergency department for continued pain in both of your flanks and sides of your abdomen.  Your workup did not show explanation for your symptoms.  You do have a mass in your lower pelvis that will need an ultrasound and follow-up with gynecology.  We are also giving you contact information for our GI team.  We are starting you on some medicine which may help.  Please return to the emergency department if any worsening or concerning symptoms.

## 2024-07-12 NOTE — ED Triage Notes (Signed)
 Pt reports she has been seen many times for this pain and no one can ever figure out was causes it, but it never goes away.  Pt reports right and left side pain.

## 2024-07-14 ENCOUNTER — Telehealth: Payer: Self-pay | Admitting: Obstetrics & Gynecology

## 2024-07-14 NOTE — Telephone Encounter (Signed)
 EDFU Please advise

## 2024-08-06 ENCOUNTER — Encounter: Payer: Self-pay | Admitting: Internal Medicine

## 2024-08-06 ENCOUNTER — Telehealth (INDEPENDENT_AMBULATORY_CARE_PROVIDER_SITE_OTHER): Payer: Self-pay

## 2024-08-06 ENCOUNTER — Ambulatory Visit: Admitting: Internal Medicine

## 2024-08-06 VITALS — BP 128/86 | HR 69 | Temp 97.8°F | Ht 67.0 in | Wt 235.6 lb

## 2024-08-06 DIAGNOSIS — R1085 Abdominal pain of multiple sites: Secondary | ICD-10-CM

## 2024-08-06 DIAGNOSIS — R112 Nausea with vomiting, unspecified: Secondary | ICD-10-CM

## 2024-08-06 DIAGNOSIS — R197 Diarrhea, unspecified: Secondary | ICD-10-CM

## 2024-08-06 DIAGNOSIS — K219 Gastro-esophageal reflux disease without esophagitis: Secondary | ICD-10-CM

## 2024-08-06 DIAGNOSIS — Z1211 Encounter for screening for malignant neoplasm of colon: Secondary | ICD-10-CM

## 2024-08-06 MED ORDER — NA SULFATE-K SULFATE-MG SULF 17.5-3.13-1.6 GM/177ML PO SOLN: ORAL | 0 refills | Status: AC

## 2024-08-06 NOTE — Progress Notes (Unsigned)
 "   Primary Care Physician:  Gerard Damien NOVAK, FNP Primary Gastroenterologist:  Dr. Cindie  Chief Complaint  Patient presents with   Follow-up    Patient here as a new patient due to having on Bilateral flank pain. She reports she has been having the pain for around a year. She has had several trips [facility] for this. Labs and scanning were done on 07/12/2024 trip. Patient report she has mass on uterus that is growing, and has  spots on ovaries patient states she has a lot of heartburn and was given pantoprazole  40 mg daily, and bentyl  20 mg to take bid, symptoms better since starting these. Patient says she has nausea and needs something for     HPI:   [name] is a 49 y.o. female who presents to clinic today as a new patient for ER follow-up visit.  Presented to [facility], ER 07/12/2024 due to bilateral flank pain.  Blood work showed WBC 13.6, normal CMP, normal lipase.  CT abdomen pelvis with contrast which I personally reviewed largely unremarkable besides 6.9 cm left adnexal mass.  Treated conservatively and discharged home with recommended GI and OB/GYN follow-up.  Discharged on dicyclomine  and PPI.  Today, states she has flares of her symptoms approx. once per week. S/p ccy.  Endorses left and right sided abdominal pain, severe, debilitating.  Last approximately 24 hours.  Denies any associated constipation.  Does note chronically loose stools since she had cholecystectomy.  Rarely has a formed stool.  No melena hematochezia.  No family history of colorectal malignancy.  Unsure about family history of inflammatory bowel disease.  She had a CT scan in April 2025 with circumferential bowel wall thickening of the distal small bowel consistent with infectious versus inflammatory neuritis.  Was treated with antibiotics at that time.  Who provided colonoscopy.  Past Medical History:  Diagnosis Date   Asthma    Bronchitis     Past Surgical History:  Procedure  Laterality Date   CHOLECYSTECTOMY     TUBAL LIGATION      Current Outpatient Medications  Medication Sig Dispense Refill   dicyclomine  (BENTYL ) 20 MG tablet Take 1 tablet (20 mg total) by mouth 2 (two) times daily. 60 tablet 0   pantoprazole  (PROTONIX ) 40 MG tablet Take 1 tablet (40 mg total) by mouth daily. 30 tablet 0   PROAIR  HFA 108 (90 Base) MCG/ACT inhaler Inhale 2 puffs into the lungs every 4 (four) hours as needed for shortness of breath.     HYDROcodone -acetaminophen  (NORCO/VICODIN) 5-325 MG tablet Take 1 tablet by mouth every 6 (six) hours as needed. (Patient not taking: Reported on 08/06/2024) 6 tablet 0   ondansetron  (ZOFRAN -ODT) 4 MG disintegrating tablet Take 1 tablet (4 mg total) by mouth every 8 (eight) hours as needed for nausea or vomiting. (Patient not taking: Reported on 08/06/2024) 20 tablet 0   oxyCODONE  (ROXICODONE ) 5 MG immediate release tablet Take 1 tablet (5 mg total) by mouth every 4 (four) hours as needed for severe pain (pain score 7-10). (Patient not taking: Reported on 08/06/2024) 10 tablet 0   No current facility-administered medications for this visit.    Allergies as of 08/06/2024 - Review Complete 08/06/2024  Allergen Reaction Noted   Codeine Other (See Comments) 12/31/2012   Nsaids Hives 05/28/2013   Pineapple Hives 12/31/2012   Sulfa antibiotics Hives, Itching, and Swelling 11/04/2013   Darvocet [propoxyphene n-acetaminophen ] Rash 12/31/2012   Toradol  [ketorolac  tromethamine ] Rash 12/31/2012  Tramadol Rash 12/31/2012    History reviewed. No pertinent family history.  Social History   Socioeconomic History   Marital status: Married    Spouse name: Not on file   Number of children: Not on file   Years of education: Not on file   Highest education level: Not on file  Occupational History   Not on file  Tobacco Use   Smoking status: Every Day    Current packs/day: 0.50    Types: Cigarettes   Smokeless tobacco: Never   Vaping Use   Vaping status: Never Used  Substance and Sexual Activity   Alcohol use: No   Drug use: No   Sexual activity: Yes  Other Topics Concern   Not on file  Social History Narrative   Not on file   Social Drivers of Health   Tobacco Use: High Risk (08/06/2024)   Patient History    Smoking Tobacco Use: Every Day    Smokeless Tobacco Use: Never    Passive Exposure: Not on file  Financial Resource Strain: Not on file  Food Insecurity: Not on file  Transportation Needs: Not on file  Physical Activity: Not on file  Stress: Not on file  Social Connections: Unknown (03/21/2022)   Received from [facility]   Social Network    Social Network: Not on file  Intimate Partner Violence: Unknown (03/21/2022)   Received from [facility]   HITS    Physically Hurt: Not on file    Insult or Talk Down To: Not on file    Threaten Physical Harm: Not on file    Scream or Curse: Not on file  Depression (EYV7-0): Not on file  Alcohol Screen: Not on file  Housing: Not on file  Utilities: Not on file  Health Literacy: Not on file    Subjective: Review of Systems  Constitutional:  Negative for chills and fever.  HENT:  Negative for congestion and hearing loss.   Eyes:  Negative for blurred vision and double vision.  Respiratory:  Negative for cough and shortness of breath.   Cardiovascular:  Negative for chest pain and palpitations.  Gastrointestinal:  Negative for abdominal pain, blood in stool, constipation, diarrhea, heartburn, melena and vomiting.  Genitourinary:  Negative for dysuria and urgency.  Musculoskeletal:  Negative for joint pain and myalgias.  Skin:  Negative for itching and rash.  Neurological:  Negative for dizziness and headaches.  Psychiatric/Behavioral:  Negative for depression. The patient is not nervous/anxious.        Objective: BP (!) 150/61 (BP Location: Left Arm, Patient Position: Sitting, Cuff Size: Normal)   Pulse 89   Temp 97.8 F  (36.6 C) (Temporal)   Ht 5' 7 (1.702 m)   Wt 235 lb 9.6 oz (106.9 kg)   BMI 36.90 kg/m  Physical Exam Constitutional:      Appearance: Normal appearance.  HENT:     Head: Normocephalic and atraumatic.  Eyes:     Extraocular Movements: Extraocular movements intact.     Conjunctiva/sclera: Conjunctivae normal.  Cardiovascular:     Rate and Rhythm: Normal rate and regular rhythm.  Pulmonary:     Effort: Pulmonary effort is normal.     Breath sounds: Normal breath sounds.  Abdominal:     General: Bowel sounds are normal.     Palpations: Abdomen is soft.  Musculoskeletal:        General: No swelling. Normal range of motion.     Cervical back: Normal range of motion and  neck supple.  Skin:    General: Skin is warm and dry.     Coloration: Skin is not jaundiced.  Neurological:     General: No focal deficit present.     Mental Status: She is alert and oriented to person, place, and time.  Psychiatric:        Mood and Affect: Mood normal.        Behavior: Behavior normal.      Assessment/Plan:   08/06/2024 2:31 PM    "

## 2024-08-06 NOTE — Telephone Encounter (Signed)
 Spoke with patient in the office, scheduled colonoscopy for 08/20/2024 at 1pm. Rx sent to pharmacy. Instructions given in person.

## 2024-08-06 NOTE — Patient Instructions (Signed)
 We will schedule you for colonoscopy to further evaluate your symptoms as well as for colon cancer screening purposes.  I am also going to order blood work at [facility].  Agree with seeing OB/GYN.  It was very nice seeing you both today.  Dr. Cindie

## 2024-08-06 NOTE — Telephone Encounter (Signed)
 No PA needed with [place] [employer]

## 2024-08-10 LAB — ALPHA-GAL PANEL
Allergen, Mutton, f88: <0.1 kU/L
Allergen, Pork, f26: <0.1 kU/L
Beef: <0.1 kU/L
CLASS: 0
CLASS: 0
Class: 0
GALACTOSE-ALPHA-1,3-GALACTOSE IGE*: <0.1 kU/L

## 2024-08-10 LAB — CELIAC DISEASE PANEL
(tTG) Ab, IgA: <1 U/mL
(tTG) Ab, IgG: <1 U/mL
Deamidated Gliadin Abs, IgG: 1.8 U/mL
Gliadin IgA: 1.8 U/mL
Immunoglobulin A: 170 mg/dL (ref 47–310)

## 2024-08-10 LAB — INTERPRETATION:

## 2024-08-15 ENCOUNTER — Ambulatory Visit: Payer: Self-pay | Admitting: Internal Medicine

## 2024-08-18 ENCOUNTER — Encounter: Payer: Self-pay | Admitting: Internal Medicine

## 2024-08-19 NOTE — Telephone Encounter (Signed)
 Spoke with pt. Rescheduled with Dr. Cindie to 2/26. Aware will send new instructions via my chart

## 2024-08-21 ENCOUNTER — Other Ambulatory Visit: Payer: Self-pay | Admitting: Obstetrics & Gynecology

## 2024-08-21 DIAGNOSIS — N9489 Other specified conditions associated with female genital organs and menstrual cycle: Secondary | ICD-10-CM

## 2024-08-21 DIAGNOSIS — R10A0 Flank pain, unspecified side: Secondary | ICD-10-CM

## 2024-09-01 ENCOUNTER — Encounter: Admitting: Obstetrics & Gynecology

## 2024-09-01 ENCOUNTER — Ambulatory Visit

## 2024-09-01 ENCOUNTER — Other Ambulatory Visit: Admitting: Radiology

## 2024-09-18 ENCOUNTER — Ambulatory Visit (HOSPITAL_COMMUNITY): Admission: RE | Admit: 2024-09-18 | Source: Home / Self Care | Admitting: Internal Medicine

## 2024-09-18 ENCOUNTER — Encounter (HOSPITAL_COMMUNITY): Admission: RE | Payer: Self-pay | Source: Home / Self Care
